# Patient Record
Sex: Female | Born: 1985
Health system: Southern US, Community
[De-identification: ages and names within clinical notes are randomized; demographics above are authoritative.]

## PROBLEM LIST (undated history)

## (undated) DIAGNOSIS — F988 Other specified behavioral and emotional disorders with onset usually occurring in childhood and adolescence: Secondary | ICD-10-CM

## (undated) DIAGNOSIS — I7300 Raynaud's syndrome without gangrene: Secondary | ICD-10-CM

## (undated) HISTORY — PX: TONSILLECTOMY: SUR1361

## (undated) HISTORY — DX: Other specified behavioral and emotional disorders with onset usually occurring in childhood and adolescence: F98.8

## (undated) HISTORY — DX: Raynaud's syndrome without gangrene: I73.00

---

## 2017-07-23 MED FILL — AMPHETAMINE-DEXTROAMPHETAMI: 10 | 30 days supply | Qty: 60 | Fill #0

## 2017-08-20 MED FILL — AMPHETAMINE-DEXTROAMPHETAMI: 10 | 30 days supply | Qty: 60 | Fill #0

## 2017-10-01 ENCOUNTER — Encounter: Payer: Self-pay | Admitting: Family Medicine

## 2017-10-01 ENCOUNTER — Ambulatory Visit (INDEPENDENT_AMBULATORY_CARE_PROVIDER_SITE_OTHER): Payer: No Typology Code available for payment source | Admitting: Family Medicine

## 2017-10-01 VITALS — BP 108/66 | HR 68 | Temp 97.8°F | Ht 69.0 in | Wt 144.2 lb

## 2017-10-01 DIAGNOSIS — Z7689 Persons encountering health services in other specified circumstances: Secondary | ICD-10-CM

## 2017-10-01 DIAGNOSIS — F988 Other specified behavioral and emotional disorders with onset usually occurring in childhood and adolescence: Secondary | ICD-10-CM | POA: Diagnosis not present

## 2017-10-01 DIAGNOSIS — F418 Other specified anxiety disorders: Secondary | ICD-10-CM | POA: Diagnosis not present

## 2017-10-01 MED ORDER — PROPRANOLOL HCL 10 MG PO TABS: 10.0000 mg | ORAL_TABLET | Freq: Every day | ORAL | 2 refills | Status: DC | PRN

## 2017-10-01 MED ORDER — ALPRAZOLAM 0.5 MG PO TABS: 0.5000 mg | ORAL_TABLET | Freq: Every evening | ORAL | 0 refills | Status: DC | PRN

## 2017-10-01 MED FILL — PROPRANOLOL 10 MG TABLET: 10 | 30 days supply | Qty: 30 | Fill #0

## 2017-10-01 MED FILL — ALPRAZolam 0.5 MG TABS: 0.5 | 30 days supply | Qty: 30 | Fill #0

## 2017-10-01 NOTE — Progress Notes (Signed)
   Subjective:    Patient ID: Holly Leblanc, female    DOB: 06/01/1985, 32 y.o.   MRN: [id number]  HPI Chief Complaint  Patient presents with  . other     establish care and med magmt   She is new to the practice and here to establish care.  Previous medical care: Dr. Melida Quitter at [facility] in [facility] and then Dr. Sherlon Handing  She is originally from [place]. Currently in the [facility] Internal Medicine Residency.   Reports history of ADD. Diagnosed as a child. Was on medication, Ritalin, for years and then stopped. States she started back on medication 2 years ago. Prescribed by Dr. Melida Quitter in [facility].  States 10 mg lasts 3-4 hours for her. Takes 10 mg 3-4 times per day depending on her schedule. Doing well on the medication.  Denies history of addiction.   States she has anxiety with public speaking and flying. Takes propanol and alprazolam as needed. Requests refill.   Chronic left trapezius pain. She saw an orthopedist.  Subscapular dyskinesia. May want to try PT in the future if this flares [place].   Denies fever, chills, dizziness, chest pain, palpitations, shortness of breath, abdominal pain, N/V/D, urinary symptoms, LE edema.    Pap smear- 2017. Normal and negative HPV. Abnormal one age 71. Had a cervical biopsy.  IUD will need removed next March.   Other providers: None   Social history: Lives alone, single, works as internal medicine resiident. Works nights.  Denies smoking, drug use. Alcohol use is 1-2 per week.  Diet: healthy  Excerise: daily    Reviewed allergies, medications, past medical, surgical, family, and social history.   Review of Systems Pertinent positives and negatives in the history of present illness.     Objective:   Physical Exam BP 108/66 (BP Location: Left Arm, Patient Position: Sitting)   Pulse 68   Temp 97.8 F (36.6 C)   Ht 5\' 9"  (1.753 m)   Wt 144 lb 3.2 oz (65.4 kg)   SpO2 99%   BMI 21.29 kg/m   Alert and in  no distress. Pharyngeal area is normal. Neck is supple without adenopathy or thyromegaly. Cardiac exam shows a regular sinus rhythm without murmurs or gallops. Lungs are clear to auscultation. Extremities without edema. Skin is warm and dry.       Assessment & Plan:  Attention deficit disorder, unspecified hyperactivity presence  Encounter to establish care  Situational anxiety - Plan: ALPRAZolam (XANAX) 0.5 MG tablet, propranolol (INDERAL) 10 MG tablet  She is a pleasant 32 year old female who is new to the practice. Currently an internal medicine resident for [facility] and new to the area. Situational anxiety with speaking which her new position requires fairly often.  Prescriptions for propranolol and alprazolam sent to her pharmacy. Currently taking Adderall daily.  I will request records from her previous provider in [place] who started her back on this medication.  ADD since childhood.  I will get proper documentation and prescribed medication for her.

## 2017-10-08 ENCOUNTER — Telehealth: Payer: Self-pay | Admitting: Family Medicine

## 2017-10-08 NOTE — Telephone Encounter (Signed)
Received requested records from [employer]. Sending back for review.  ALSO, pt called today to inquire of records had been received. She wanted me to remind Vickie that she is needing a refill on medication. She was to wait until records were received.

## 2017-10-08 NOTE — Telephone Encounter (Signed)
I did not get the records. Can you check on this. Thanks.

## 2017-10-09 ENCOUNTER — Encounter: Payer: Self-pay | Admitting: Family Medicine

## 2017-10-09 ENCOUNTER — Other Ambulatory Visit: Payer: Self-pay | Admitting: Family Medicine

## 2017-10-09 DIAGNOSIS — F909 Attention-deficit hyperactivity disorder, unspecified type: Secondary | ICD-10-CM

## 2017-10-09 MED ORDER — AMPHETAMINE-DEXTROAMPHETAMINE 10 MG PO TABS: 10.0000 mg | ORAL_TABLET | Freq: Two times a day (BID) | ORAL | 0 refills | Status: DC

## 2017-10-09 MED FILL — AMPHETAMINE-DEXTROAMPHETAMI: 10 | 30 days supply | Qty: 60 | Fill #0

## 2017-10-20 ENCOUNTER — Encounter: Payer: Self-pay | Admitting: Family Medicine

## 2017-11-18 ENCOUNTER — Other Ambulatory Visit: Payer: Self-pay | Admitting: Family Medicine

## 2017-11-18 DIAGNOSIS — F909 Attention-deficit hyperactivity disorder, unspecified type: Secondary | ICD-10-CM

## 2017-11-19 NOTE — Telephone Encounter (Deleted)
Is this okay to refill? Last seen 10/01/17

## 2017-11-24 MED FILL — AMPHETAMINE-DEXTROAMPHETAMI: 10 | 30 days supply | Qty: 60 | Fill #0

## 2017-12-22 ENCOUNTER — Ambulatory Visit (INDEPENDENT_AMBULATORY_CARE_PROVIDER_SITE_OTHER): Payer: No Typology Code available for payment source | Admitting: Family Medicine

## 2017-12-22 ENCOUNTER — Encounter: Payer: Self-pay | Admitting: Family Medicine

## 2017-12-22 VITALS — BP 120/70 | HR 72 | Wt 151.4 lb

## 2017-12-22 DIAGNOSIS — Z975 Presence of (intrauterine) contraceptive device: Secondary | ICD-10-CM | POA: Diagnosis not present

## 2017-12-22 DIAGNOSIS — F909 Attention-deficit hyperactivity disorder, unspecified type: Secondary | ICD-10-CM

## 2017-12-22 MED ORDER — AMPHETAMINE-DEXTROAMPHETAMINE 10 MG PO TABS: 10.0000 mg | ORAL_TABLET | Freq: Two times a day (BID) | ORAL | 0 refills | Status: DC

## 2017-12-22 NOTE — Progress Notes (Signed)
   Subjective:    Patient ID: Holly Leblanc, female    DOB: 1985/10/06, 32 y.o.   MRN: [id number]  HPI Chief Complaint  Patient presents with  . med refill    med refill. needs referral to obgyn for pap and IUD removal. has focus.    She is here for a follow-up on ADHD and requesting refill of Adderall. She is taking Adderall 5 mg 2-3 times per day most days. Lasts 3-4 hours at a time.  States at times 10 mg seems to be too much for her.   She also requests referral to OB/GYN.  She currently has an IUD that will soon need to be removed. She prefers to see Holly Leblanc for [facility].   Took propanolol and alprazolam for presentation. Did fine.  States she does not need refills on these.  No new concerns or complaints.  Denies fever, chills, headache, dizziness, chest pain, shortness of breath, abdominal pain, nausea, vomiting, diarrhea.  Review of Systems Pertinent positives and negatives in the history of present illness.     Objective:   Physical Exam BP 120/70   Pulse 72   Wt 151 lb 6.4 oz (68.7 kg)   LMP 12/08/2017   BMI 22.36 kg/m   Alert and oriented and in no acute distress.  Not otherwise examined.      Assessment & Plan:  Attention deficit hyperactivity disorder (ADHD), unspecified ADHD type - Plan: amphetamine-dextroamphetamine (ADDERALL) 10 MG tablet, amphetamine-dextroamphetamine (ADDERALL) 10 MG tablet, amphetamine-dextroamphetamine (ADDERALL) 10 MG tablet  IUD (intrauterine device) in place  She seems to be doing well on Adderall however she has cut her dose back to 5 mg at a time for now.  Medication seems to be lasting an appropriate length of time and no obvious side effects.  504-month refill provided.  She will let me know if we need to adjust the dose Referral to OB/GYN for IUD that is due to be removed shortly. Follow-up in 3 months

## 2017-12-23 ENCOUNTER — Ambulatory Visit: Payer: No Typology Code available for payment source | Admitting: Family Medicine

## 2018-01-05 MED FILL — AMPHETAMINE-DEXTROAMPHETAMI: 10 | 30 days supply | Qty: 60 | Fill #0

## 2018-02-08 ENCOUNTER — Encounter: Payer: Self-pay | Admitting: Advanced Practice Midwife

## 2018-02-08 ENCOUNTER — Ambulatory Visit (INDEPENDENT_AMBULATORY_CARE_PROVIDER_SITE_OTHER): Payer: No Typology Code available for payment source | Admitting: Advanced Practice Midwife

## 2018-02-08 VITALS — BP 105/70 | HR 69 | Ht 69.0 in | Wt 145.0 lb

## 2018-02-08 DIAGNOSIS — N6012 Diffuse cystic mastopathy of left breast: Secondary | ICD-10-CM

## 2018-02-08 DIAGNOSIS — N6011 Diffuse cystic mastopathy of right breast: Secondary | ICD-10-CM

## 2018-02-08 DIAGNOSIS — Z01419 Encounter for gynecological examination (general) (routine) without abnormal findings: Secondary | ICD-10-CM

## 2018-02-08 DIAGNOSIS — Z30432 Encounter for removal of intrauterine contraceptive device: Secondary | ICD-10-CM

## 2018-02-08 DIAGNOSIS — Z3009 Encounter for other general counseling and advice on contraception: Secondary | ICD-10-CM

## 2018-02-08 NOTE — Progress Notes (Signed)
Subjective:     Holly Leblanc is a 33 y.o. female here for a routine exam.  Current complaints: None. Holly Leblanc is 33 years old, pt desires IUD removal.  Some bleeding after intercourse with Holly Leblanc, no other problems.  Is not currently sexually active so does not desire new contraceptive method today.   Personal health questionnaire reviewed: yes.   Gynecologic History Patient's last menstrual period was 02/05/2018. Contraception: IUD Last Pap: 2017. Results were: normal Last mammogram: 2018 for abnormal exam. Results were: normal/benign breast changes per pt  Obstetric History OB History  No obstetric history on file.     The following portions of the patient's history were reviewed and updated as appropriate: allergies, current medications, past family history, past medical history, past social history, past surgical history and problem list.  Review of Systems Pertinent items noted in HPI and remainder of comprehensive ROS otherwise negative.    Objective:   VS reviewed, nursing note reviewed,  Constitutional: well developed, well nourished, no distress HEENT: normocephalic CV: normal rate Pulm/chest wall: normal effort Breast Exam:  right breast normal with some rope-like soft, smooth, round masses, skin or nipple changes or axillary nodes, left breast normal with some rope-like soft, smooth, round masses, skin or nipple changes or axillary nodes Abdomen: soft Neuro: alert and oriented x 3 Skin: warm, dry Psych: affect normal Pelvic exam: Cervix pink, visually closed, without lesion, IUD string visible, approximately 3 cm in length from cervical os, small amount dark red/brown bleeding, vaginal walls and external genitalia normal Bimanual exam: Cervix 0/long/high, firm, anterior, neg CMT, uterus nontender, nonenlarged, adnexa without tenderness, enlargement, or mass  IUD Removal  Patient was in the dorsal lithotomy position, normal external genitalia was noted.  A  speculum was placed in the patient's vagina, normal discharge was noted, no lesions. The nulliparous cervix was visualized, no lesions, no abnormal discharge.  The strings of the IUD were grasped and pulled using ring forceps. The IUD was removed in its entirety. Patient tolerated the procedure well.    Patient is not sexually active and does not desire contraception currently.    Routine preventative health maintenance measures emphasized.      Assessment/Plan:   1. Well woman exam with routine gynecological exam --Pap, physical exam.  2. Encounter for IUD removal --Pt tolerated well. See procedure note above.  3. Encounter for counseling regarding contraception --Discussed LARCs as most effective forms of birth control.  Discussed benefits/risks of other methods.  Pt desires no contraception because she is not currently sexually active.  Will seek care when she needs contraception in the future.   4. Fibrocystic breast changes of both breasts --Benign breast exam, some tenderness in left breast c/w fibrocystic changes.  Discussed breast self awareness, self exams as desired.    Contraception: abstinence  Follow up in: 1 year or as needed.   Sharen Counter, CNM 12:28 PM

## 2018-02-09 ENCOUNTER — Encounter: Payer: Self-pay | Admitting: Family Medicine

## 2018-02-10 MED FILL — AMPHETAMINE-DEXTROAMPHETAMI: 10 | 30 days supply | Qty: 60 | Fill #0

## 2018-02-11 LAB — CYTOLOGY - PAP

## 2018-03-19 MED FILL — AMPHETAMINE-DEXTROAMPHETAMI: 10 | 30 days supply | Qty: 60 | Fill #0

## 2018-03-31 ENCOUNTER — Other Ambulatory Visit: Payer: Self-pay | Admitting: Family Medicine

## 2018-03-31 ENCOUNTER — Encounter: Payer: Self-pay | Admitting: Family Medicine

## 2018-03-31 DIAGNOSIS — F909 Attention-deficit hyperactivity disorder, unspecified type: Secondary | ICD-10-CM

## 2018-03-31 MED ORDER — ALPRAZOLAM 0.5 MG PO TABS: 0.5000 mg | ORAL_TABLET | Freq: Every day | ORAL | 0 refills | Status: DC | PRN

## 2018-03-31 MED ORDER — AMPHETAMINE-DEXTROAMPHETAMINE 10 MG PO TABS: 10.0000 mg | ORAL_TABLET | Freq: Two times a day (BID) | ORAL | 0 refills | Status: DC

## 2018-03-31 NOTE — Telephone Encounter (Signed)
Is this okay to refill? 

## 2018-03-31 NOTE — Telephone Encounter (Signed)
Sent to [facility] outpatient pharmacy due to recent temporary closure of [facility] pharmacy.

## 2018-04-01 MED FILL — ALPRAZolam 0.5 MG TABS: 0.5 | 20 days supply | Qty: 20 | Fill #0

## 2018-04-01 MED FILL — AMPHETAMINE-DEXTROAMPHETAMI: 10 | 30 days supply | Qty: 60 | Fill #0

## 2018-05-15 ENCOUNTER — Other Ambulatory Visit: Payer: Self-pay | Admitting: Family Medicine

## 2018-05-15 DIAGNOSIS — F909 Attention-deficit hyperactivity disorder, unspecified type: Secondary | ICD-10-CM

## 2018-05-17 MED ORDER — AMPHETAMINE-DEXTROAMPHETAMINE 10 MG PO TABS: 10.0000 mg | ORAL_TABLET | Freq: Two times a day (BID) | ORAL | 0 refills | Status: DC

## 2018-05-17 NOTE — Telephone Encounter (Signed)
Is this okay to refill? 

## 2018-05-17 NOTE — Telephone Encounter (Signed)
Let's schedule a virtual visit since I have not discussed this with her since December. Thanks.

## 2018-06-05 MED FILL — AMPHETAMINE-DEXTROAMPHETAMI: 10 | 30 days supply | Qty: 60 | Fill #0

## 2018-07-08 ENCOUNTER — Other Ambulatory Visit: Payer: Self-pay | Admitting: Family Medicine

## 2018-07-08 DIAGNOSIS — F909 Attention-deficit hyperactivity disorder, unspecified type: Secondary | ICD-10-CM

## 2018-07-08 MED ORDER — AMPHETAMINE-DEXTROAMPHETAMINE 10 MG PO TABS: 10.0000 mg | ORAL_TABLET | Freq: Two times a day (BID) | ORAL | 0 refills | Status: DC

## 2018-07-08 MED FILL — AMPHETAMINE-DEXTROAMPHETAMI: 10 | 30 days supply | Qty: 60 | Fill #0

## 2018-07-08 NOTE — Telephone Encounter (Signed)
Is this okay to refill? 

## 2018-07-09 ENCOUNTER — Encounter: Payer: Self-pay | Admitting: Family Medicine

## 2018-08-10 NOTE — Progress Notes (Signed)
Subjective:    Patient ID: Holly Leblanc, female    DOB: 01-08-1985, 33 y.o.   MRN: [id number]  HPI Chief Complaint  Patient presents with  . Gynecologic Exam    repeat   . Medication Refill    Adderall  . Back Pain    upper back pain and wants referral    Here for follow up on ADHD. Taking Adderall 5 mg 2-4 times per day.  It is lasting 2-3 hours at a time. States she has never tried taking the whole tablet (10 mg).  No side effects. Normal appetite. Sleeping ok but does take melatonin some nights. Cut back on caffeine.   States she had a pap smear at her OB/GYN office in February 2020 and it came back unsatisfactory. Is overdue to have another pap smear. Would like to do this here today.   LMP: one week ago. irregular. IUD removed in 02/2018. Has had 3 periods since and bleeding has been light and shorter.  Is not on birth control. States she would be ok if she were to get pregnant.  Is considering freezing eggs and will check with OB/GYN  Would like to have STD testing today.   Complains of chronic left low back, left trapezius and left mid back. She saw an orthopedist in [place], [place].  Had XRs in the past. Pain is diffuse on her left side.  Pain is worse with sitting. At times she has spasms. Pain has awakened her before but not usually.  Using Voltaren gel and Biofreeze. Takes NSAIDs occasionally.   Denies fever, chills, body aches, numbness, tingling or weakness, no abdominal pain, N/V/D, urinary symptoms, vaginal discharge or dyspareunia.   Reviewed allergies, medications, past medical, surgical, family, and social history.   Review of Systems Pertinent positives and negatives in the history of present illness.     Objective:   Physical Exam Exam conducted with a chaperone present.  Constitutional:      General: She is not in acute distress.    Appearance: Normal appearance. She is not ill-appearing.  Abdominal:     General: Abdomen is flat.   Palpations: Abdomen is soft.     Tenderness: There is no abdominal tenderness.  Genitourinary:    General: Normal vulva.     Labia:        Right: No rash, tenderness or lesion.        Left: No rash, tenderness or lesion.      Vagina: Normal.     Cervix: Normal.     Uterus: Normal.      Adnexa: Right adnexa normal and left adnexa normal.  Neurological:     Mental Status: She is alert.    BP 100/70   Pulse 67   Temp 98 F (36.7 C) (Oral)   Ht 5\' 9"  (1.753 m)   Wt 144 lb 12.8 oz (65.7 kg)   SpO2 98%   BMI 21.38 kg/m       Assessment & Plan:  Attention deficit disorder, unspecified hyperactivity presence - Plan: amphetamine-dextroamphetamine (ADDERALL) 10 MG tablet, discussed that she is having to take the 5 mg dose too often. She will try taking the whole tablet 10 mg and let me know how this works for her.   Screening for cervical cancer - Plan: Cytology - PAP([place]), follow up pending results.   Other chronic back pain - Plan: Ambulatory referral to Physical Therapy   Unprotected sexual intercourse - Plan: RPR, HIV Antibody (routine  testing w rflx), states she will be pleased if she were to get pregnant. No contraception desired.

## 2018-08-11 ENCOUNTER — Encounter: Payer: Self-pay | Admitting: Family Medicine

## 2018-08-11 ENCOUNTER — Ambulatory Visit (INDEPENDENT_AMBULATORY_CARE_PROVIDER_SITE_OTHER): Payer: No Typology Code available for payment source | Admitting: Family Medicine

## 2018-08-11 ENCOUNTER — Other Ambulatory Visit: Payer: Self-pay

## 2018-08-11 ENCOUNTER — Other Ambulatory Visit (HOSPITAL_COMMUNITY)
Admission: RE | Admit: 2018-08-11 | Discharge: 2018-08-11 | Disposition: A | Discharge status: Home / Self Care | Payer: No Typology Code available for payment source | Source: Ambulatory Visit | Attending: Family Medicine | Admitting: Family Medicine

## 2018-08-11 VITALS — BP 100/70 | HR 67 | Temp 98.0°F | Ht 69.0 in | Wt 144.8 lb

## 2018-08-11 DIAGNOSIS — Z7251 High risk heterosexual behavior: Secondary | ICD-10-CM | POA: Diagnosis not present

## 2018-08-11 DIAGNOSIS — G8929 Other chronic pain: Secondary | ICD-10-CM

## 2018-08-11 DIAGNOSIS — M549 Dorsalgia, unspecified: Secondary | ICD-10-CM | POA: Diagnosis not present

## 2018-08-11 DIAGNOSIS — Z124 Encounter for screening for malignant neoplasm of cervix: Secondary | ICD-10-CM | POA: Insufficient documentation

## 2018-08-11 DIAGNOSIS — F988 Other specified behavioral and emotional disorders with onset usually occurring in childhood and adolescence: Secondary | ICD-10-CM

## 2018-08-11 MED ORDER — AMPHETAMINE-DEXTROAMPHETAMINE 10 MG PO TABS: 10.0000 mg | ORAL_TABLET | Freq: Two times a day (BID) | ORAL | 0 refills | Status: DC

## 2018-08-11 MED FILL — AMPHETAMINE-DEXTROAMPHETAMI: 10 | 30 days supply | Qty: 60 | Fill #0

## 2018-08-11 NOTE — Patient Instructions (Addendum)
Try taking the full 10 mg Adderall tablet and let's see how you do with this. Keep me posted please.   We will contact you with your results. You should hear from [facility] in the next few days.

## 2018-08-12 LAB — RPR: RPR Ser Ql: NONREACTIVE

## 2018-08-12 LAB — CYTOLOGY - PAP
Adequacy: ABSENT
Chlamydia: NEGATIVE
Diagnosis: NEGATIVE
HPV: NOT DETECTED
Neisseria Gonorrhea: NEGATIVE
Trichomonas: NEGATIVE

## 2018-08-12 LAB — HIV ANTIBODY (ROUTINE TESTING W REFLEX): HIV Screen 4th Generation wRfx: NONREACTIVE

## 2018-08-19 ENCOUNTER — Encounter: Payer: Self-pay | Admitting: Family Medicine

## 2018-08-29 ENCOUNTER — Encounter: Payer: Self-pay | Admitting: Family Medicine

## 2018-08-29 DIAGNOSIS — Z3162 Encounter for fertility preservation counseling: Secondary | ICD-10-CM

## 2018-09-10 ENCOUNTER — Other Ambulatory Visit: Payer: Self-pay | Admitting: Family Medicine

## 2018-09-10 DIAGNOSIS — F988 Other specified behavioral and emotional disorders with onset usually occurring in childhood and adolescence: Secondary | ICD-10-CM

## 2018-09-14 ENCOUNTER — Telehealth: Payer: Self-pay | Admitting: Internal Medicine

## 2018-09-14 ENCOUNTER — Other Ambulatory Visit: Payer: Self-pay | Admitting: Family Medicine

## 2018-09-14 DIAGNOSIS — F988 Other specified behavioral and emotional disorders with onset usually occurring in childhood and adolescence: Secondary | ICD-10-CM

## 2018-09-14 NOTE — Telephone Encounter (Signed)
Pt is being seen tomorrow for a virtual with you.

## 2018-09-14 NOTE — Telephone Encounter (Signed)
Left message for pt to call back to schedule.

## 2018-09-14 NOTE — Telephone Encounter (Signed)
Pt called and scheduled something for sept 16th but states that she is working 12 hour shifts for the next 2 weeks and can't do virtuals during the day as she is sleeping. Wants to know if she can get a refill

## 2018-09-14 NOTE — Telephone Encounter (Signed)
Will need a virtual visit in order for me to refill ADHD medication. This is a controlled substance and requires proper documentation.

## 2018-09-14 NOTE — Telephone Encounter (Signed)
Is this okay to refill? 

## 2018-09-14 NOTE — Telephone Encounter (Signed)
Let's do a virtual visit before refilling since I recommended increasing her dose to the full 10 mg last month. Also, we referred her to PT and I would like to see how this is going.

## 2018-09-15 ENCOUNTER — Other Ambulatory Visit: Payer: Self-pay

## 2018-09-15 ENCOUNTER — Ambulatory Visit (INDEPENDENT_AMBULATORY_CARE_PROVIDER_SITE_OTHER): Payer: No Typology Code available for payment source | Admitting: Family Medicine

## 2018-09-15 ENCOUNTER — Encounter: Payer: Self-pay | Admitting: Family Medicine

## 2018-09-15 VITALS — Wt 142.0 lb

## 2018-09-15 DIAGNOSIS — F988 Other specified behavioral and emotional disorders with onset usually occurring in childhood and adolescence: Secondary | ICD-10-CM

## 2018-09-15 MED ORDER — AMPHETAMINE-DEXTROAMPHETAMINE 10 MG PO TABS: 10.0000 mg | ORAL_TABLET | Freq: Two times a day (BID) | ORAL | 0 refills | Status: DC

## 2018-09-15 MED FILL — AMPHETAMINE-DEXTROAMPHETAMI: 10 | 30 days supply | Qty: 60 | Fill #0

## 2018-09-15 NOTE — Progress Notes (Addendum)
   Subjective:  Documentation for virtual audio and video telecommunications through [employer] encounter:  The patient was located at home. 2 patient identifiers used.  The provider was located in the office. The patient did consent to this visit and is aware of possible charges through their insurance for this visit.  The other persons participating in this telemedicine service were none.    Patient ID: Holly Leblanc, female    DOB: 1985/03/15, 33 y.o.   MRN: [id number]  HPI Chief Complaint  Patient presents with  . refill on adderall    refill on adderall. taking .5 up to 3 times a day.    This is a 4 week follow up to discuss ADHD and current medication regimen. At the previous visit she reported taking 5 mg Adderall 2-4 times per day. She reported then that she had never taken 10 mg at one time. Stated it was "too much medication" for her.  States the 5 mg dose lasts around 4 hours for her. Denies any side effects when it wears off.  Sleeping well. No appetite concerns.  She does not want to take the 10 mg tablet but is cutting it in half.   No concerns today.   Denies fever, chills, N/V.   Back pain- did not see PT yet due to insurance concerns. Has upcoming appt.     Review of Systems Pertinent positives and negatives in the history of present illness.     Objective:   Physical Exam Wt 142 lb (64.4 kg)   BMI 20.97 kg/m   Alert and oriented in no acute distress.  Not examined otherwise      Assessment & Plan:  Attention deficit disorder, unspecified hyperactivity presence - Plan: amphetamine-dextroamphetamine (ADDERALL) 10 MG tablet, amphetamine-dextroamphetamine (ADDERALL) 10 MG tablet, amphetamine-dextroamphetamine (ADDERALL) 10 MG tablet  She is adamant that he does not want to take 10 mg tablets.  She will continue on the 5 mg dose 3-4 times per day for now.  She is happy with this regimen.  We did discuss trying an extended release dose but she  declines. No issues with sleep or appetite. Follow-up in 3 months.  At that time I would like to find out examples of how she knows the medication is working for her and how she knows when the medication wears off  Time spent on call was 16 minutes and in review of previous records 2 minutes total.  This virtual service is not related to other E/M service within previous 7 days.

## 2018-09-22 ENCOUNTER — Encounter: Payer: No Typology Code available for payment source | Admitting: Family Medicine

## 2018-10-19 MED FILL — AMPHETAMINE-DEXTROAMPHETAMI: 10 | 30 days supply | Qty: 60 | Fill #0

## 2018-11-02 ENCOUNTER — Encounter: Payer: Self-pay | Admitting: Family Medicine

## 2018-11-16 ENCOUNTER — Other Ambulatory Visit: Payer: Self-pay | Admitting: Internal Medicine

## 2018-11-16 DIAGNOSIS — G8929 Other chronic pain: Secondary | ICD-10-CM

## 2018-11-22 MED FILL — AMPHETAMINE-DEXTROAMPHETAMI: 10 | 30 days supply | Qty: 60 | Fill #0

## 2018-12-01 MED FILL — ESTRADIOL 0.1 MG PATCH: 0.1 | 28 days supply | Qty: 8 | Fill #0

## 2018-12-09 ENCOUNTER — Ambulatory Visit: Payer: No Typology Code available for payment source

## 2018-12-15 MED FILL — MENOPUR 75 UNIT VIAL: 75 | 10 days supply | Qty: 10 | Fill #0

## 2018-12-16 ENCOUNTER — Other Ambulatory Visit: Payer: Self-pay

## 2018-12-16 DIAGNOSIS — F988 Other specified behavioral and emotional disorders with onset usually occurring in childhood and adolescence: Secondary | ICD-10-CM

## 2018-12-16 MED ORDER — AMPHETAMINE-DEXTROAMPHETAMINE 10 MG PO TABS: 10.0000 mg | ORAL_TABLET | Freq: Two times a day (BID) | ORAL | 0 refills | Status: DC

## 2018-12-16 NOTE — Telephone Encounter (Signed)
Ok to give her a 30 day refill.

## 2018-12-16 NOTE — Telephone Encounter (Signed)
Pt. Called wanted to schedule a med check apt. She wanted tomorrow or early next week, but you are off those days, got her scheduled on 01/13/19 for a virtual med check, but pt. Stated she needs a refill on her Adderall before the weekend she is almost out of medication.

## 2018-12-17 MED FILL — GONAL-F RFF REDI-JECT 450 U: 450 | 3 days supply | Qty: 2 | Fill #0

## 2018-12-22 MED FILL — AMPHETAMINE-DEXTROAMPHETAMI: 10 | 30 days supply | Qty: 60 | Fill #0

## 2019-01-13 ENCOUNTER — Encounter: Payer: Self-pay | Admitting: Family Medicine

## 2019-01-13 ENCOUNTER — Ambulatory Visit (INDEPENDENT_AMBULATORY_CARE_PROVIDER_SITE_OTHER): Payer: No Typology Code available for payment source | Admitting: Family Medicine

## 2019-01-13 ENCOUNTER — Other Ambulatory Visit: Payer: Self-pay

## 2019-01-13 VITALS — BP 101/64 | HR 70 | Temp 97.7°F | Resp 16 | Wt 140.0 lb

## 2019-01-13 DIAGNOSIS — F988 Other specified behavioral and emotional disorders with onset usually occurring in childhood and adolescence: Secondary | ICD-10-CM

## 2019-01-13 MED ORDER — AMPHETAMINE-DEXTROAMPHETAMINE 10 MG PO TABS: 10.0000 mg | ORAL_TABLET | Freq: Two times a day (BID) | ORAL | 0 refills | Status: DC

## 2019-01-13 NOTE — Progress Notes (Signed)
   Subjective:  Documentation for virtual audio and video telecommunications through [employer] encounter:  The patient was located at work. 2 patient identifiers used.  The provider was located in the office. The patient did consent to this visit and is aware of possible charges through their insurance for this visit.  The other persons participating in this telemedicine service were none.    Patient ID: Holly Leblanc, female    DOB: May 09, 1985, 34 y.o.   MRN: [id number]  HPI Chief Complaint  Patient presents with  . med check    med check- needs refill on adderall   This is a medication management visit for ADHD.   She is taking Adderall 5 mg 2-3 times per day. Takes break on the weekends.   Takes 10 mg when she has a big day at work.  Denies any issues with sleep or appetite.   Regular cycles.  Is not sexually active.   No other concerns today.   Reviewed allergies, medications, past medical, surgical, family, and social history.   Review of Systems Pertinent positives and negatives in the history of present illness.     Objective:   Physical Exam BP 101/64   Pulse 70   Temp 97.7 F (36.5 C) (Oral)   Resp 16   Wt 140 lb (63.5 kg)   SpO2 100%   BMI 20.67 kg/m   Alert and oriented and in no acute distress.  Normal speech, mood and thought process.      Assessment & Plan:  Attention deficit disorder, unspecified hyperactivity presence - Plan: amphetamine-dextroamphetamine (ADDERALL) 10 MG tablet, amphetamine-dextroamphetamine (ADDERALL) 10 MG tablet, amphetamine-dextroamphetamine (ADDERALL) 10 MG tablet  She is in her usual state of health.  Seems to be doing well on her current regimen of Adderall.  I will refill her medication for 3 months.  She will follow-up at that time either in office or in virtual  Time spent on call was 13 minutes and in review of previous records 20 minutes total.  This virtual service is not related to other E/M service  within previous 7 days.

## 2019-01-25 MED FILL — AMPHETAMINE-DEXTROAMPHETAMI: 10 | 30 days supply | Qty: 60 | Fill #0

## 2019-02-18 ENCOUNTER — Other Ambulatory Visit: Payer: Self-pay | Admitting: Internal Medicine

## 2019-02-18 ENCOUNTER — Encounter: Payer: Self-pay | Admitting: Family Medicine

## 2019-02-18 DIAGNOSIS — M549 Dorsalgia, unspecified: Secondary | ICD-10-CM

## 2019-02-18 DIAGNOSIS — G8929 Other chronic pain: Secondary | ICD-10-CM

## 2019-02-20 ENCOUNTER — Encounter: Payer: Self-pay | Admitting: Family Medicine

## 2019-03-02 MED FILL — AMPHETAMINE-DEXTROAMPHETAMI: 10 | 30 days supply | Qty: 60 | Fill #0

## 2019-03-10 ENCOUNTER — Ambulatory Visit: Payer: No Typology Code available for payment source | Attending: Family Medicine | Admitting: Physical Therapy

## 2019-03-10 ENCOUNTER — Encounter: Payer: Self-pay | Admitting: Physical Therapy

## 2019-03-10 ENCOUNTER — Other Ambulatory Visit: Payer: Self-pay

## 2019-03-10 DIAGNOSIS — M545 Low back pain, unspecified: Secondary | ICD-10-CM

## 2019-03-10 DIAGNOSIS — M6281 Muscle weakness (generalized): Secondary | ICD-10-CM | POA: Insufficient documentation

## 2019-03-10 DIAGNOSIS — M25552 Pain in left hip: Secondary | ICD-10-CM | POA: Insufficient documentation

## 2019-03-10 DIAGNOSIS — M25512 Pain in left shoulder: Secondary | ICD-10-CM | POA: Diagnosis not present

## 2019-03-10 DIAGNOSIS — G8929 Other chronic pain: Secondary | ICD-10-CM | POA: Insufficient documentation

## 2019-03-10 NOTE — Therapy (Addendum)
[place], [place], [address] Phone: [phone]   Fax:  [phone]  Physical Therapy Evaluation / Discharge  Patient Details  Name: Holly Leblanc MRN: [id number] Date of Birth: 04-10-1985 Referring Provider (PT): Girtha Rm, NP-C   Encounter Date: 03/10/2019  PT End of Session - 03/10/19 0930    Visit Number  1    Number of Visits  8    Date for PT Re-Evaluation  05/05/19    Authorization Type  [facility] [employer]    PT Start Time  0915    PT Stop Time  1010    PT Time Calculation (min)  55 min    Activity Tolerance  Patient tolerated treatment well    Behavior During Therapy  [facility] for tasks assessed/performed       Past Medical History:  Diagnosis Date  . ADD (attention deficit disorder)     Past Surgical History:  Procedure Laterality Date  . TONSILLECTOMY      There were no vitals filed for this visit.   Subjective Assessment - 03/10/19 0918    Subjective  Patient reports chronic left sided pain that has progressed from starting in left scapular region, progressing to left shoulder including chest, sternum, and upper trap, and progressing down left side of back and left hip and proximal hamstring region. She states that she believes this is from being left handed and uneven which has changed how she sits and moves. She has worked on her posture in the past and has started to foam roll which has helped some. Pain mainly occurs when she is performing heavy exercise and when she sits she feels discomfort of ischial tuberosity.    Limitations  Sitting;Lifting    How long can you sit comfortably?  Feels the ischial tuberosity    How long can you stand comfortably?  No limitation    How long can you walk comfortably?  No limitation    Patient Stated Goals  Improve pain so she can perform all activity without limitation    Currently in Pain?  Yes    Pain Score  1     Pain Location  --   Left  shoulder to left hip   Pain Orientation  Left    Pain Descriptors / Indicators  Tightness;Aching    Pain Type  Chronic pain    Pain Radiating Towards  Left scapular region, left shoulder and chest, left trunk, left hip and ischial tuberosity region    Pain Onset  More than a month ago    Pain Frequency  Constant    Aggravating Factors   Sitting, working out shoulders and hips    Pain Relieving Factors  Rest    Effect of Pain on Daily Activities  Patient feels she is limited in her exercise and recreational activities         [facility] PT Assessment - 03/10/19 0001      Assessment   Medical Diagnosis  Chronic back pain    Referring Provider (PT)  Raenette Rover, Vickie L, NP-C    Onset Date/Surgical Date  --   patient reports for numerous years   Hand Dominance  Left    Next MD Visit  within next 3 months    Prior Therapy  Yes - for left hip region      Precautions   Precautions  None      Restrictions   Weight Bearing Restrictions  No  Balance Screen   Has the patient fallen in the past 6 months  No    Has the patient had a decrease in activity level because of a fear of falling?   No    Is the patient reluctant to leave their home because of a fear of falling?   No      Prior Function   Level of Independence  Independent    Vocation Requirements  Patient is an internal medicine resident at [employer]  Exercise, martial arts      Cognition   Overall Cognitive Status  Within Functional Limits for tasks assessed      Observation/Other Assessments   Observations  Patient appears in no apparent distress    Focus on Therapeutic Outcomes (FOTO)   28% limitation      Functional Tests   Functional tests  Squat      Squat   Comments  No significant deviations, she does exhibit a quad dominant form      Posture/Postural Control   Posture Comments  Patient exhibits rounded shoulder posture, mild forward head      ROM / Strength   AROM / PROM / Strength   PROM;AROM;Strength      AROM   Overall AROM Comments  Shoulder and Lumbar AROM grossly WFL bilaterally without pain, except:    AROM Assessment Site  Shoulder;Lumbar    Right/Left Shoulder  Left    Left Shoulder Internal Rotation  --   increased scapular winging when reaching behind her back    Lumbar Flexion  --   patient reports pulling in left proximal hamstring     PROM   Overall PROM Comments  Hip PROM grossly WFL and non-painful, reports stretch of piriformis on left      Strength   Overall Strength Comments  Periscapular strength grossly 4/5 bilat, patient reports left shoulder felt weaker;     Strength Assessment Site  Hip;Knee;Shoulder    Right/Left Shoulder  Right;Left    Right Shoulder Flexion  5/5    Right Shoulder Extension  5/5    Right Shoulder ABduction  5/5    Right Shoulder Internal Rotation  5/5    Right Shoulder External Rotation  5/5    Left Shoulder Flexion  5/5    Left Shoulder Extension  5/5    Left Shoulder ABduction  5/5    Left Shoulder Internal Rotation  5/5    Left Shoulder External Rotation  5/5    Right/Left Hip  Right;Left    Right Hip Flexion  4+/5    Right Hip Extension  4/5    Right Hip Internal Rotation  4/5    Right Hip ABduction  4/5    Left Hip Flexion  4/5    Left Hip Extension  4-/5    Left Hip Internal Rotation  4/5    Left Hip ABduction  4-/5   patient report lateral hip discomfort   Right/Left Knee  Right;Left    Right Knee Flexion  5/5    Right Knee Extension  5/5    Left Knee Flexion  5/5    Left Knee Extension  5/5      Flexibility   Soft Tissue Assessment /Muscle Length  yes    Hamstrings  WNL    Quadriceps  WNL    Piriformis  Reported tightness greater on left      Special Tests    Special Tests  Rotator Cuff Impingement;Biceps/Labral  Tests;Hip Special Tests;Sacrolliac Tests    Rotator Cuff Impingment tests  Neer impingement test;Hawkins- Merrilyn Puma test;Speed's test    Sacroiliac Tests   Sacral Thrust    Hip Special  Tests   Saralyn Pilar (FABER) Test;Hip Scouring;Anterior Hip Impingement Test;Piriformis Test      Neer Impingement test    Findings  Negative      Hawkins-Kennedy test   Findings  Negative      Speed's test   Findings  Negative      Sacral thrust    Findings  Negative      Saralyn Pilar (FABER) Test   Comments  Mildly positive on left for lateral hip pain      Hip Scouring   Findings  Negative      Anterior Hip Impingement Test    Findings  Negative      Piriformis Test   Findings  Negative      Transfers   Transfers  Independent with all Transfers      Ambulation/Gait   Ambulation/Gait  Yes    Ambulation/Gait Assistance  7: Independent    Gait Comments  No significant deviations                Objective measurements completed on examination: See above findings.      [facility] Adult PT Treatment/Exercise - 03/10/19 0001      Exercises   Exercises  Shoulder;Knee/Hip      Knee/Hip Exercises: Supine   Bridges  10 reps    Bridges Limitations  3 sec hold with red band around knees       Knee/Hip Exercises: Sidelying   Clams  x15 with red band      Shoulder Exercises: Prone   Other Prone Exercises  Prone I, T, Y with 3 sec hold x5 each              PT Education - 03/10/19 0623    Education Details  Exam findings, POC, HEP    Person(s) Educated  Patient    Methods  Explanation;Demonstration;Tactile cues;Verbal cues;Handout    Comprehension  Verbalized understanding;Returned demonstration;Verbal cues required;Tactile cues required;Need further instruction       PT Short Term Goals - 03/10/19 1121      PT SHORT TERM GOAL #1   Title  Patient will be I with initial HEP to progress with therapy    Time  4    Period  Weeks    Status  New    Target Date  04/07/19      PT SHORT TERM GOAL #2   Title  Patient will report improved ability to perform usual work and school activities with no difficulty    Time  4    Period  Weeks    Status  New    Target  Date  04/07/19        PT Long Term Goals - 03/10/19 1125      PT LONG TERM GOAL #1   Title  Patient will be I with advanced HEP to maintain progress    Time  8    Period  Weeks    Status  New    Target Date  05/05/19      PT LONG TERM GOAL #2   Title  Patient will report no difficulty performing vigorous exercise or recreational activities    Time  8    Period  Weeks    Status  New    Target Date  05/05/19  PT LONG TERM GOAL #3   Title  Patient will report no difficulty sitting >1 hour to allow for improved working ability    Time  8    Period  Weeks    Status  New    Target Date  05/05/19      PT LONG TERM GOAL #4   Title  Patient will exhibit improved gluteal and periscapular strength to allow for to feel more even and balanced    Time  8    Period  Weeks    Status  New    Target Date  05/05/19             Plan - 03/10/19 0930    Clinical Impression Statement  Patient reports to PT with report of chronic left sided scapular pain that has gradually worsened to left shoulder/neck, left sided upper and lower back, and left hip pain with activity and sitting. Her main impairments include strength deficits of the core, left gluteal region, and left periscapular region, and she exhibits increased scapular winging when reaching behind her back on the left. She has good range of motion and flexibility throughout, but does not trigger points of left lat region. It is unclear what is generating her pain outside of compensation from muscular strength and endurance deficits. She would benefit from continued skilled PT to progress her strength in order to reduce pain with activity and sitting.    Personal Factors and Comorbidities  Time since onset of injury/illness/exacerbation    Examination-Activity Limitations  Lift;Sit    Examination-Participation Restrictions  Community Activity;School    Stability/Clinical Decision Making  Stable/Uncomplicated    Clinical Decision  Making  Low    Rehab Potential  Good    PT Frequency  1x / week    PT Duration  8 weeks    PT Treatment/Interventions  ADLs/Self Care Home Management;Cryotherapy;Electrical Stimulation;Moist Heat;Neuromuscular re-education;Balance training;Therapeutic exercise;Therapeutic activities;Functional mobility training;Stair training;Gait training;Patient/family education;Manual techniques;Dry needling;Passive range of motion;Taping;Spinal Manipulations;Joint Manipulations    PT Next Visit Plan  Assess HEP and progress PRN, manual and dry needling for left shoulder/lat PRN, progress gluteal and strength exercises (bird dog, hip hinge technique), periscapular strengthening    PT Home Exercise Plan  Clamshell with red, bridge with red; prone I, T, Y    Consulted and Agree with Plan of Care  Patient       Patient will benefit from skilled therapeutic intervention in order to improve the following deficits and impairments:  Decreased strength, Postural dysfunction, Decreased activity tolerance, Pain, Increased muscle spasms  Visit Diagnosis: Chronic left shoulder pain  Chronic left-sided low back pain without sciatica  Pain in left hip  Muscle weakness (generalized)     Problem List Patient Active Problem List   Diagnosis Date Noted  . Notalgia 08/11/2018  . Situational anxiety 10/01/2017  . ADD (attention deficit disorder)     Hilda Blades, PT, DPT, LAT, ATC 03/10/19  12:31 PM Phone: [phone] Fax: [facility] [address] [place], [place], [address] Phone: [phone]   Fax:  [phone]  Name: Holly Leblanc MRN: [id number] Date of Birth: 05/13/1985    PHYSICAL THERAPY DISCHARGE SUMMARY  Visits from Start of Care: 1  Current functional level related to goals / functional outcomes: See above   Remaining deficits: See above   Education / Equipment: HEP Plan: Patient agrees to discharge.   Patient goals were not met. Patient is being discharged due to  not returning since the last visit.  ?????    Hilda Blades, PT, DPT, LAT, ATC 06/15/19  8:54 AM Phone: [phone] Fax: [phone]

## 2019-03-10 NOTE — Patient Instructions (Signed)
Access Code: [email or web address]  URL: [email or web address]  Date: 03/10/2019  Prepared by: Rosana Hoes   Exercises Clamshell with Resistance - 15 reps - 3 sets - 1x daily - 7x weekly Supine Bridge with Resistance Band - 10 reps - 3 sets - 1x daily - 7x weekly Prone Scapular Slide with Shoulder Extension - 10 reps - 2 sets - 3 seconds hold - 1x daily - 7x weekly Prone T - 10 reps - 2 sets - 3 seconds hold - 1x daily - 7x weekly Prone Y - 10 reps - 2 sets - 3 seconds hold - 1x daily - 7x weekly

## 2019-03-24 ENCOUNTER — Ambulatory Visit: Payer: No Typology Code available for payment source | Admitting: Physical Therapy

## 2019-04-01 ENCOUNTER — Ambulatory Visit: Payer: No Typology Code available for payment source | Admitting: Physical Therapy

## 2019-04-07 ENCOUNTER — Ambulatory Visit (INDEPENDENT_AMBULATORY_CARE_PROVIDER_SITE_OTHER): Payer: No Typology Code available for payment source | Admitting: Family Medicine

## 2019-04-07 ENCOUNTER — Encounter: Payer: Self-pay | Admitting: Family Medicine

## 2019-04-07 ENCOUNTER — Other Ambulatory Visit: Payer: Self-pay

## 2019-04-07 VITALS — BP 104/64 | HR 60 | Temp 97.1°F | Wt 147.2 lb

## 2019-04-07 DIAGNOSIS — F988 Other specified behavioral and emotional disorders with onset usually occurring in childhood and adolescence: Secondary | ICD-10-CM | POA: Diagnosis not present

## 2019-04-07 MED ORDER — AMPHETAMINE-DEXTROAMPHETAMINE 10 MG PO TABS: 10.0000 mg | ORAL_TABLET | Freq: Two times a day (BID) | ORAL | 0 refills | Status: DC

## 2019-04-07 MED FILL — AMPHETAMINE-DEXTROAMPHETAMI: 10 | 30 days supply | Qty: 60 | Fill #0

## 2019-04-07 NOTE — Progress Notes (Signed)
   Subjective:    Patient ID: Holly Leblanc, female    DOB: Aug 13, 1985, 34 y.o.   MRN: [id number]  HPI Chief Complaint  Patient presents with  . follow-up    doing well on meds needs refill   She is here to follow up on ADD and for renewal of her medication. Reports taking 5 mg 2-3 times per day but lately she has been taking 10 mg and this seems to be more beneficial. Her last dose is typically at 6 pm and she denies any issues with sleeping.  Appetite is good.   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  No new concerns today.   Reviewed allergies, medications, past medical, surgical, family, and social history.    Review of Systems Pertinent positives and negatives in the history of present illness.     Objective:   Physical Exam BP 104/64   Pulse 60   Temp (!) 97.1 F (36.2 C)   Wt 147 lb 3.2 oz (66.8 kg)   BMI 21.74 kg/m   Alert and oriented and in no acute distress.  Not otherwise examined.      Assessment & Plan:  Attention deficit disorder, unspecified hyperactivity presence  She is here to follow-up on ADD and to get refills of her Adderall.  PDMP reviewed and no aberrancies found.  She seems to be doing well.  Reports being in her usual state of health.  I will refill her Adderall for 3 months.  She will return when she is due for her CPE.

## 2019-05-09 MED FILL — AMPHETAMINE-DEXTROAMPHETAMI: 10 | 30 days supply | Qty: 60 | Fill #0

## 2019-05-27 ENCOUNTER — Other Ambulatory Visit: Payer: Self-pay

## 2019-05-27 ENCOUNTER — Other Ambulatory Visit: Payer: Self-pay | Admitting: Family Medicine

## 2019-05-27 ENCOUNTER — Ambulatory Visit (INDEPENDENT_AMBULATORY_CARE_PROVIDER_SITE_OTHER): Payer: Self-pay | Admitting: Plastic Surgery

## 2019-05-27 ENCOUNTER — Encounter: Payer: Self-pay | Admitting: Plastic Surgery

## 2019-05-27 DIAGNOSIS — L719 Rosacea, unspecified: Secondary | ICD-10-CM

## 2019-05-27 NOTE — Progress Notes (Signed)
Preoperative Dx: Rosacea  Postoperative Dx:  same  Procedure: laser to face  Anesthesia: none  Description of Procedure:  Risks and complications were explained to the patient. Consent was confirmed and signed. Time out was called and all information was confirmed to be correct. The area  area was prepped with alcohol and wiped dry. The IPL laser was set at 5 J/cm2. The face was lasered. The patient tolerated the procedure well and there were no complications. The patient is to follow up in 4 weeks.

## 2019-05-30 MED ORDER — ALPRAZOLAM 0.5 MG PO TABS: 0.5000 mg | ORAL_TABLET | Freq: Every day | ORAL | 0 refills | Status: DC | PRN

## 2019-05-30 MED FILL — ALPRAZolam 0.5 MG TABS: 0.5 | 20 days supply | Qty: 20 | Fill #0

## 2019-06-10 MED FILL — AMPHETAMINE-DEXTROAMPHETAMI: 10 | 30 days supply | Qty: 60 | Fill #0

## 2019-07-08 ENCOUNTER — Ambulatory Visit (INDEPENDENT_AMBULATORY_CARE_PROVIDER_SITE_OTHER): Payer: No Typology Code available for payment source

## 2019-07-08 ENCOUNTER — Other Ambulatory Visit: Payer: Self-pay

## 2019-07-08 VITALS — BP 103/60 | HR 63 | Temp 98.3°F | Wt 147.0 lb

## 2019-07-08 DIAGNOSIS — Z872 Personal history of diseases of the skin and subcutaneous tissue: Secondary | ICD-10-CM

## 2019-07-08 DIAGNOSIS — L719 Rosacea, unspecified: Secondary | ICD-10-CM

## 2019-07-08 MED FILL — AMPHETAMINE SALTS 10 MG: 10 | 30 days supply | Qty: 60 | Fill #0

## 2019-07-10 ENCOUNTER — Encounter: Payer: Self-pay | Admitting: Family Medicine

## 2019-07-14 DIAGNOSIS — Z872 Personal history of diseases of the skin and subcutaneous tissue: Secondary | ICD-10-CM | POA: Insufficient documentation

## 2019-07-14 NOTE — Progress Notes (Signed)
Preoperative Dx: facial rosacea  Postoperative Dx:  same  Procedure: laser to face  Anesthesia: EMLA -pt applied  prior to procedure  Description of Procedure:  Risks and complications were explained to the patient. Consent was confirmed and signed. Time out was called and all information was confirmed to be correct. The area  was prepped with alcohol and wiped dry. The ND Yag laser was set at the following:  skin -1 suntan light - -ND Yag was used at 0.1/0.2 measurements  & 4/3- intensity- ND Yag to red vessels on nose- bilaterally Then IPL laser used to bilateral cheek areas for rosacea- Setting: skin 1/sun -light= 5.0 & decreased to 4.6 under both eyes  The patient tolerated the procedure well and there were no complications.  Aloe applied & provided ice for pt to use Pt understands to protect skin with sunscreen & moisturizer & avoid retina products & no abrasive scrubs for approx. 7 to 10 days  She is reminded that she may experience redness/peeling & irritation Patient to f/u in 6 weeks She will call for any concerns [facility]

## 2019-07-14 NOTE — Patient Instructions (Signed)
Pt is reminded to call for any concerns- otherwise she will protect skin from sun & keep moisturized

## 2019-08-01 ENCOUNTER — Encounter: Payer: Self-pay | Admitting: Family Medicine

## 2019-08-01 NOTE — Telephone Encounter (Signed)
We can probably do this with a virtual visit or have him come in

## 2019-08-03 ENCOUNTER — Other Ambulatory Visit: Payer: Self-pay

## 2019-08-03 ENCOUNTER — Telehealth (INDEPENDENT_AMBULATORY_CARE_PROVIDER_SITE_OTHER): Payer: No Typology Code available for payment source | Admitting: Family Medicine

## 2019-08-03 ENCOUNTER — Encounter: Payer: Self-pay | Admitting: Family Medicine

## 2019-08-03 ENCOUNTER — Ambulatory Visit (HOSPITAL_COMMUNITY)
Admission: RE | Admit: 2019-08-03 | Discharge: 2019-08-03 | Disposition: A | Discharge status: Home / Self Care | Payer: No Typology Code available for payment source | Source: Ambulatory Visit | Attending: Family Medicine | Admitting: Family Medicine

## 2019-08-03 VITALS — BP 106/60 | Temp 97.5°F | Wt 147.0 lb

## 2019-08-03 DIAGNOSIS — M79675 Pain in left toe(s): Secondary | ICD-10-CM | POA: Insufficient documentation

## 2019-08-03 NOTE — Progress Notes (Signed)
   Subjective:    Patient ID: Holly Leblanc, female    DOB: 1985/04/16, 34 y.o.   MRN: [id number]  HPI I connected with  Holly Leblanc on 08/03/19 by a video enabled telemedicine application and verified that I am speaking with the correct person using two identifiers. I discussed the limitations of evaluation and management by telemedicine. The patient expressed understanding and agreed to proceed on [place] the video with quite poor.  No other people were present for this encounter. She states that in early June she injured her left fourth toe and treated this conservatively.  She had done fairly well with that until the last week and a half when she noted an increase in the discomfort.  She has kept her busy schedule and does work out but has noticed that she has no altering her normal pattern because of the pain.   Review of Systems     Objective:   Physical Exam Alert and in no distress otherwise not examined       Assessment & Plan:

## 2019-08-05 ENCOUNTER — Other Ambulatory Visit: Payer: Self-pay

## 2019-08-05 DIAGNOSIS — M79675 Pain in left toe(s): Secondary | ICD-10-CM

## 2019-08-10 ENCOUNTER — Other Ambulatory Visit: Payer: Self-pay | Admitting: Family Medicine

## 2019-08-10 DIAGNOSIS — F988 Other specified behavioral and emotional disorders with onset usually occurring in childhood and adolescence: Secondary | ICD-10-CM

## 2019-08-10 NOTE — Telephone Encounter (Signed)
Is this okay to refill? 

## 2019-08-12 ENCOUNTER — Other Ambulatory Visit: Payer: Self-pay | Admitting: Family Medicine

## 2019-08-12 DIAGNOSIS — F988 Other specified behavioral and emotional disorders with onset usually occurring in childhood and adolescence: Secondary | ICD-10-CM

## 2019-08-12 MED FILL — AMPHETAMINE SALTS 10 MG: 10 | 30 days supply | Qty: 60 | Fill #0

## 2019-08-12 NOTE — Telephone Encounter (Signed)
This was done yesterday. Please deny

## 2019-08-12 NOTE — Telephone Encounter (Signed)
Please deny since refilled tomorrow

## 2019-08-15 ENCOUNTER — Ambulatory Visit: Payer: No Typology Code available for payment source | Admitting: Orthopedic Surgery

## 2019-08-16 ENCOUNTER — Encounter: Payer: Self-pay | Admitting: Family

## 2019-08-16 ENCOUNTER — Ambulatory Visit (INDEPENDENT_AMBULATORY_CARE_PROVIDER_SITE_OTHER): Payer: No Typology Code available for payment source | Admitting: Orthopedic Surgery

## 2019-08-16 VITALS — Ht 69.0 in | Wt 147.0 lb

## 2019-08-16 DIAGNOSIS — S92535A Nondisplaced fracture of distal phalanx of left lesser toe(s), initial encounter for closed fracture: Secondary | ICD-10-CM | POA: Diagnosis not present

## 2019-08-16 NOTE — Progress Notes (Signed)
   Office Visit Note   Patient: Holly Leblanc           Date of Birth: 1985/09/20           MRN: [id number] Visit Date: 08/16/2019              Requested by: Ronnald Nian, MD [address] [place],  [place] [address] PCP: Avanell Shackleton, NP-C  Chief Complaint  Patient presents with  . Left Foot - Pain    4th toe injury kicked a weight in June      HPI: Patient is a 34 year old woman who presents status post blunt trauma sustaining a nondisplaced fracture distal phalanx fourth toe left foot.  Patient states she still has pain with increasing her activities.  Assessment & Plan: Visit Diagnoses:  1. Closed nondisplaced fracture of distal phalanx of lesser toe of left foot, initial encounter     Plan: Recommended a stiff soled Trail running sneaker she may increase her activities as tolerated no restrictions.  Follow-Up Instructions: Return if symptoms worsen or fail to improve.   Ortho Exam  Patient is alert, oriented, no adenopathy, well-dressed, normal affect, normal respiratory effort. Examination patient has good range of motion of the fourth toe she has good pulses.  There is a little bit of swelling around the toe there is some mild redness that resolves with massage consistent with the healing process.  She has good ankle and subtalar motion.  The toe was not tender to palpation.  Imaging: No results found. No images are attached to the encounter.  Labs: No results found for: HGBA1C, ESRSEDRATE, CRP, LABURIC, REPTSTATUS, GRAMSTAIN, CULT, LABORGA   No results found for: ALBUMIN, PREALBUMIN, LABURIC  No results found for: MG No results found for: VD25OH  No results found for: PREALBUMIN No flowsheet data found.   Body mass index is 21.71 kg/m.  Orders:  No orders of the defined types were placed in this encounter.  No orders of the defined types were placed in this encounter.    Procedures: No procedures performed  Clinical  Data: No additional findings.  ROS:  All other systems negative, except as noted in the HPI. Review of Systems  Objective: Vital Signs: Ht 5\' 9"  (1.753 m)   Wt 147 lb (66.7 kg)   BMI 21.71 kg/m   Specialty Comments:  No specialty comments available.  PMFS History: Patient Active Problem List   Diagnosis Date Noted  . History of rosacea 07/14/2019  . Rosacea 05/27/2019  . Notalgia 08/11/2018  . Situational anxiety 10/01/2017  . ADD (attention deficit disorder)    Past Medical History:  Diagnosis Date  . ADD (attention deficit disorder)     No family history on file.  Past Surgical History:  Procedure Laterality Date  . TONSILLECTOMY     Social History   Occupational History  . Not on file  Tobacco Use  . Smoking status: Never Smoker  . Smokeless tobacco: Never Used  Substance and Sexual Activity  . Alcohol use: Yes    Alcohol/week: 2.0 - 4.0 standard drinks    Types: 1 - 2 Glasses of wine, 1 - 2 Cans of beer per week  . Drug use: Never  . Sexual activity: Not Currently    Partners: Male    Birth control/protection: Condom, I.U.D.

## 2019-08-22 ENCOUNTER — Other Ambulatory Visit: Payer: Self-pay

## 2019-08-22 ENCOUNTER — Ambulatory Visit (INDEPENDENT_AMBULATORY_CARE_PROVIDER_SITE_OTHER): Payer: Self-pay

## 2019-08-22 VITALS — BP 124/76 | HR 75 | Temp 98.1°F | Ht 69.0 in | Wt 150.0 lb

## 2019-08-22 DIAGNOSIS — Z872 Personal history of diseases of the skin and subcutaneous tissue: Secondary | ICD-10-CM

## 2019-08-30 ENCOUNTER — Encounter: Payer: Self-pay | Admitting: Family Medicine

## 2019-08-31 ENCOUNTER — Other Ambulatory Visit (HOSPITAL_COMMUNITY): Payer: Self-pay | Admitting: Family Medicine

## 2019-08-31 ENCOUNTER — Encounter: Payer: Self-pay | Admitting: Family Medicine

## 2019-08-31 ENCOUNTER — Ambulatory Visit (INDEPENDENT_AMBULATORY_CARE_PROVIDER_SITE_OTHER): Payer: No Typology Code available for payment source | Admitting: Family Medicine

## 2019-08-31 ENCOUNTER — Other Ambulatory Visit: Payer: Self-pay

## 2019-08-31 VITALS — BP 110/60 | HR 63 | Temp 97.9°F | Wt 150.8 lb

## 2019-08-31 DIAGNOSIS — Z113 Encounter for screening for infections with a predominantly sexual mode of transmission: Secondary | ICD-10-CM

## 2019-08-31 DIAGNOSIS — N898 Other specified noninflammatory disorders of vagina: Secondary | ICD-10-CM | POA: Diagnosis not present

## 2019-08-31 DIAGNOSIS — F988 Other specified behavioral and emotional disorders with onset usually occurring in childhood and adolescence: Secondary | ICD-10-CM

## 2019-08-31 DIAGNOSIS — B373 Candidiasis of vulva and vagina: Secondary | ICD-10-CM | POA: Diagnosis not present

## 2019-08-31 DIAGNOSIS — B3731 Acute candidiasis of vulva and vagina: Secondary | ICD-10-CM

## 2019-08-31 LAB — POCT WET PREP (WET MOUNT)
Clue Cells Wet Prep Whiff POC: NEGATIVE
Trichomonas Wet Prep HPF POC: ABSENT

## 2019-08-31 LAB — POCT URINE PREGNANCY: Preg Test, Ur: NEGATIVE

## 2019-08-31 MED ORDER — AMPHETAMINE-DEXTROAMPHETAMINE 10 MG PO TABS: ORAL_TABLET | ORAL | 0 refills | Status: DC

## 2019-08-31 MED ORDER — FLUCONAZOLE 150 MG PO TABS: 150.0000 mg | ORAL_TABLET | Freq: Once | ORAL | 1 refills | Status: AC

## 2019-08-31 MED ORDER — AMPHETAMINE-DEXTROAMPHETAMINE 10 MG PO TABS: 10.0000 mg | ORAL_TABLET | Freq: Two times a day (BID) | ORAL | 0 refills | Status: DC

## 2019-08-31 MED FILL — FLUCONAZOLE 150 MG TABS: 150 | 1 days supply | Qty: 1 | Fill #0

## 2019-08-31 NOTE — Progress Notes (Signed)
° °  Subjective:    Patient ID: Holly Leblanc, female    DOB: 10-17-1985, 34 y.o.   MRN: [id number]  HPI Chief Complaint  Patient presents with   yeast infection    yeast infection- 3 weeks- itchy, discomfort, white/clear discharge, no odor   Complains of a 3 week h/o vaginal itching and thick, clumpy white discharge.  No odor. She would like to be tested for STDs.  Declines HIV or RPR.  Denies fever, chills, abdominal pain, back pain, nausea, vomiting or urinary symptoms.  Requests refill of Adderall.  States she is taking 10 mg twice daily and doing well.  No concerns.  Requests pregnancy test to ensure she is not pregnant.   Review of Systems Pertinent positives and negatives in the history of present illness.     Objective:   Physical Exam Exam conducted with a chaperone present.  Constitutional:      General: She is not in acute distress.    Appearance: Normal appearance. She is not ill-appearing.  Genitourinary:    Labia:        Right: No rash, tenderness or lesion.        Left: No rash, tenderness or lesion.      Vagina: Vaginal discharge present.     Comments: Speculum exam performed only. Thick, white and clumpy discharge in vaginal vault.  Neurological:     Mental Status: She is alert.    BP 110/60    Pulse 63    Temp 97.9 F (36.6 C)    Wt 150 lb 12.8 oz (68.4 kg)    BMI 22.27 kg/m         Assessment & Plan:  Candidiasis of vagina - Plan: fluconazole (DIFLUCAN) 150 MG tablet, POCT Wet Prep (Wet [place])  Vaginal itching - Plan: fluconazole (DIFLUCAN) 150 MG tablet, POCT urine pregnancy, NuSwab Vaginitis Plus (VG+), POCT Wet Prep [facility])  Attention deficit disorder, unspecified hyperactivity presence - Plan: amphetamine-dextroamphetamine (ADDERALL) 10 MG tablet, amphetamine-dextroamphetamine (ADDERALL) 10 MG tablet  Screen for STD (sexually transmitted disease) - Plan: POCT urine pregnancy  UPT negative.  Wet prep +yeast. Negative trich  and BV Treat with diflucan.  Nuswab ordered to screen for STDs. She declines HIV, RPR ADD- doing well on current dose of Adderall and seems to be taking it appropriately without side effects.  PDMP reviewed.

## 2019-09-05 LAB — NUSWAB VAGINITIS PLUS (VG+)
Candida albicans, NAA: POSITIVE — AB
Candida glabrata, NAA: NEGATIVE
Chlamydia trachomatis, NAA: NEGATIVE
Neisseria gonorrhoeae, NAA: NEGATIVE
Trich vag by NAA: NEGATIVE

## 2019-09-06 NOTE — Progress Notes (Signed)
Preoperative Dx: facial aging  Postoperative Dx:  same  Procedure: laser to face  Anesthesia: EMLA -pt applied 40 mins prior to procedure  Description of Procedure:  Risks and complications were explained to the patient. Consent was confirmed and signed. Time out was called and all information was confirmed to be correct. Pre-procedure photos were obtained & entered into chart  The area was prepped with alcohol and wiped dry.  The ND Yag laser was set at the following:  skin -1/ suntan light [place]/red/0.1 vessels= 209.4J Nose & upper cheek areas were treated with ND YAG IPL (PR530)- was used on upper cheek area for redness Settings: skin-1/sun-0 = 5.0J/cm2 Pt tolerated the entire laser procedure without any difficulty Aloe gel applied to areas & pt is reminded to protect skin from sun/retinol products & keep moisturized She will call for any concerns F/u in 4-6 weeks if needed  The patient tolerated the procedure well and there were no complications.  Aloe applied & provided ice for pt to use She is reminded to protect skin with sunscreen & moisturizer & avoid retina products & no abrasive scrubs for approx. 7 to 10 days  She is reminded that she may experience redness/peeling & irritation Patient to f/u in 4- 6 weeks She will call for any concerns [facility]

## 2019-09-06 NOTE — Patient Instructions (Signed)
Protect skin from sun- use sunscreen/hat Moisturize area  F/u in 4-6 weeks if needed & call for any concerns

## 2019-09-13 MED FILL — AMPHETAMINE SALTS 10 MG: 10 | 30 days supply | Qty: 60 | Fill #0

## 2019-09-21 ENCOUNTER — Encounter: Payer: Self-pay | Admitting: Family Medicine

## 2019-09-21 DIAGNOSIS — G8929 Other chronic pain: Secondary | ICD-10-CM

## 2019-09-21 DIAGNOSIS — M25511 Pain in right shoulder: Secondary | ICD-10-CM

## 2019-10-07 ENCOUNTER — Encounter: Payer: Self-pay | Admitting: Family Medicine

## 2019-10-07 ENCOUNTER — Ambulatory Visit (INDEPENDENT_AMBULATORY_CARE_PROVIDER_SITE_OTHER): Payer: No Typology Code available for payment source | Admitting: Family Medicine

## 2019-10-07 ENCOUNTER — Other Ambulatory Visit: Payer: Self-pay

## 2019-10-07 VITALS — BP 120/72 | HR 91 | Ht 69.75 in | Wt 149.8 lb

## 2019-10-07 DIAGNOSIS — Z0000 Encounter for general adult medical examination without abnormal findings: Secondary | ICD-10-CM

## 2019-10-07 DIAGNOSIS — I7300 Raynaud's syndrome without gangrene: Secondary | ICD-10-CM | POA: Insufficient documentation

## 2019-10-07 DIAGNOSIS — F988 Other specified behavioral and emotional disorders with onset usually occurring in childhood and adolescence: Secondary | ICD-10-CM

## 2019-10-07 DIAGNOSIS — F418 Other specified anxiety disorders: Secondary | ICD-10-CM

## 2019-10-07 NOTE — Progress Notes (Signed)
Subjective:    Patient ID: Holly Leblanc, female    DOB: 10-01-1985, 34 y.o.   MRN: [id number]  HPI Chief Complaint  Patient presents with   cpe    nonfasting cpe, last pap 2020   She is here for a complete physical exam.  Other providers: Sports medicine- Dr. Margaretha Sheffield   Chronic trapezius and back issues and will see sports medicine for this.  Intermittent constipation that fiber works well to control.   ADD- doing fine on medication.    Social history: Lives alone, works in [employer] program. 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  Diet: healthy  Excerise: 6 days per week   Immunizations: [facility]   Health maintenance:  Mammogram: early 78s, benign  Colonoscopy: N/A Last Gynecological Exam: colposcopy 2020. Negative HPV  Last Menstrual cycle: 10/04/2019 this one is 2 weeks early and heavier  Last Dental Exam: this year  Last Eye Exam: overdue   Wears seatbelt always, uses sunscreen, smoke detectors in home and functioning, does not text while driving and feels safe in home environment.   Reviewed allergies, medications, past medical, surgical, family, and social history.   Review of Systems Review of Systems Constitutional: -fever, -chills, -sweats, -unexpected weight change,-fatigue ENT: -runny nose, -ear pain, -sore throat Cardiology:  -chest pain, -palpitations, -edema Respiratory: -cough, -shortness of breath, -wheezing Gastroenterology: -abdominal pain, -nausea, -vomiting, -diarrhea, -constipation  Hematology: -bleeding or bruising problems Musculoskeletal: -arthralgias, -myalgias, -joint swelling, -back pain Ophthalmology: -vision changes Urology: -dysuria, -difficulty urinating, -hematuria, -urinary frequency, -urgency Neurology: -headache, -weakness, -tingling, -numbness       Objective:   Physical Exam BP 120/72    Pulse 91    Ht 5' 9.75" (1.772 m)    Wt 149 lb 12.8 oz (67.9 kg)     LMP 10/04/2019    BMI 21.65 kg/m   General Appearance:    Alert, cooperative, no distress, appears stated age  Head:    Normocephalic, without obvious abnormality, atraumatic  Eyes:    PERRL, conjunctiva/corneas clear, EOM's intact  Ears:    Normal TM's and external ear canals  Nose:   Mask on   Throat:   Mask on   Neck:   Supple, no lymphadenopathy;  thyroid:  no   enlargement/tenderness/nodules; no JVD  Back:    Spine nontender, no curvature, ROM normal, no CVA     tenderness  Lungs:     Clear to auscultation bilaterally without wheezes, rales or     ronchi; respirations unlabored  Chest Wall:    No tenderness or deformity   Heart:    Regular rate and rhythm, S1 and S2 normal, no murmur, rub   or gallop  Breast Exam:    Not done. OB/GYN.  No axillary lymphadenopathy  Abdomen:     Soft, non-tender, nondistended, normoactive bowel sounds,    no masses, no hepatosplenomegaly  Genitalia:    OB/GYN  Rectal:    Not performed due to age<40 and no related complaints  Extremities:   No clubbing, cyanosis or edema  Pulses:   2+ and symmetric all extremities  Skin:   Skin color, texture, turgor normal, no rashes or lesions  Lymph nodes:   Cervical, supraclavicular, and axillary nodes normal  Neurologic:   CNII-XII intact, normal strength, sensation and gait          Psych:   Normal mood, affect, hygiene and grooming.  Assessment & Plan:  Routine general medical examination at a health care facility  Situational anxiety  Attention deficit disorder, unspecified hyperactivity presence  She is here today for her CPE.  Declines labs.  She will drop off recent labs done elsewhere. She is in good spirits.  No new concerns today. Preventive health care reviewed as well as immunizations.  She is welcome to get her Pfizer Covid booster vaccine here if she would like.  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  I cannot see these.  She appears to be taking good care  of herself and has a healthy lifestyle.  Discussed safety and health promotion.  She will call and schedule a dental exam.  Follow-up with OB/GYN when due.  Follow-up with me for ADD.  No refills needed today.

## 2019-10-07 NOTE — Patient Instructions (Signed)
Preventive Care 21-35 Years Old, Female Preventive care refers to visits with your health care provider and lifestyle choices that can promote health and wellness. This includes:  A yearly physical exam. This may also be called an annual well check.  Regular dental visits and eye exams.  Immunizations.  Screening for certain conditions.  Healthy lifestyle choices, such as eating a healthy diet, getting regular exercise, not using drugs or products that contain nicotine and tobacco, and limiting alcohol use. What can I expect for my preventive care visit? Physical exam Your health care provider will check your:  Height and weight. This may be used to calculate body mass index (BMI), which tells if you are at a healthy weight.  Heart rate and blood pressure.  Skin for abnormal spots. Counseling Your health care provider may ask you questions about your:  Alcohol, tobacco, and drug use.  Emotional well-being.  Home and relationship well-being.  Sexual activity.  Eating habits.  Work and work environment.  Method of birth control.  Menstrual cycle.  Pregnancy history. What immunizations do I need?  Influenza (flu) vaccine  This is recommended every year. Tetanus, diphtheria, and pertussis (Tdap) vaccine  You may need a Td booster every 10 years. Varicella (chickenpox) vaccine  You may need this if you have not been vaccinated. Human papillomavirus (HPV) vaccine  If recommended by your health care provider, you may need three doses over 6 months. Measles, mumps, and rubella (MMR) vaccine  You may need at least one dose of MMR. You may also need a second dose. Meningococcal conjugate (MenACWY) vaccine  One dose is recommended if you are age 19-21 years and a first-year college student living in a residence hall, or if you have one of several medical conditions. You may also need additional booster doses. Pneumococcal conjugate (PCV13) vaccine  You may need  this if you have certain conditions and were not previously vaccinated. Pneumococcal polysaccharide (PPSV23) vaccine  You may need one or two doses if you smoke cigarettes or if you have certain conditions. Hepatitis A vaccine  You may need this if you have certain conditions or if you travel or work in places where you may be exposed to hepatitis A. Hepatitis B vaccine  You may need this if you have certain conditions or if you travel or work in places where you may be exposed to hepatitis B. Haemophilus influenzae type b (Hib) vaccine  You may need this if you have certain conditions. You may receive vaccines as individual doses or as more than one vaccine together in one shot (combination vaccines). Talk with your health care provider about the risks and benefits of combination vaccines. What tests do I need?  Blood tests  Lipid and cholesterol levels. These may be checked every 5 years starting at age 20.  Hepatitis C test.  Hepatitis B test. Screening  Diabetes screening. This is done by checking your blood sugar (glucose) after you have not eaten for a while (fasting).  Sexually transmitted disease (STD) testing.  BRCA-related cancer screening. This may be done if you have a family history of breast, ovarian, tubal, or peritoneal cancers.  Pelvic exam and Pap test. This may be done every 3 years starting at age 21. Starting at age 30, this may be done every 5 years if you have a Pap test in combination with an HPV test. Talk with your health care provider about your test results, treatment options, and if necessary, the need for more tests.   Follow these instructions at home: Eating and drinking   Eat a diet that includes fresh fruits and vegetables, whole grains, lean protein, and low-fat dairy.  Take vitamin and mineral supplements as recommended by your health care provider.  Do not drink alcohol if: ? Your health care provider tells you not to drink. ? You are  pregnant, may be pregnant, or are planning to become pregnant.  If you drink alcohol: ? Limit how much you have to 0-1 drink a day. ? Be aware of how much alcohol is in your drink. In the [place] one drink equals one 12 oz bottle of beer (355 mL), one 5 oz glass of wine (148 mL), or one 1 oz glass of hard liquor (44 mL). Lifestyle  Take daily care of your teeth and gums.  Stay active. Exercise for at least 30 minutes on 5 or more days each week.  Do not use any products that contain nicotine or tobacco, such as cigarettes, e-cigarettes, and chewing tobacco. If you need help quitting, ask your health care provider.  If you are sexually active, practice safe sex. Use a condom or other form of birth control (contraception) in order to prevent pregnancy and STIs (sexually transmitted infections). If you plan to become pregnant, see your health care provider for a preconception visit. What's next?  Visit your health care provider once a year for a well check visit.  Ask your health care provider how often you should have your eyes and teeth checked.  Stay up to date on all vaccines. This information is not intended to replace advice given to you by your health care provider. Make sure you discuss any questions you have with your health care provider. Document Revised: 09/03/2017 Document Reviewed: 09/03/2017 Elsevier Patient Education  2020 [employer].

## 2019-10-13 MED FILL — AMPHETAMINE SALTS 10 MG: 10 | 30 days supply | Qty: 60 | Fill #0

## 2019-10-26 ENCOUNTER — Encounter: Payer: Self-pay | Admitting: Family Medicine

## 2019-10-27 ENCOUNTER — Ambulatory Visit: Payer: No Typology Code available for payment source

## 2019-10-28 ENCOUNTER — Ambulatory Visit (INDEPENDENT_AMBULATORY_CARE_PROVIDER_SITE_OTHER): Payer: Self-pay

## 2019-10-28 ENCOUNTER — Other Ambulatory Visit: Payer: Self-pay

## 2019-10-28 VITALS — BP 110/71 | HR 79 | Temp 98.1°F | Ht 69.75 in | Wt 148.0 lb

## 2019-10-28 DIAGNOSIS — Z719 Counseling, unspecified: Secondary | ICD-10-CM

## 2019-10-28 NOTE — Patient Instructions (Signed)
Pt is reminded to protect skin from sun/avoid retina products & use moisturizer as needed. She will call for any concerns F/U 6 weeks for next treatment plan.

## 2019-10-28 NOTE — Progress Notes (Signed)
Preoperative Dx: facial rosacea/hyperpigmentation  Postoperative Dx:  same  Procedure: laser to face  Anesthesia: none  Description of Procedure:  Dr. Ulice Bold consulted with pt regarding her concern with the hyperpigmentation/rosacea area on her upper cheek/face area & she also  continues to have redness/teleangiectasia on her nose. Dr. Ulice Bold suggested  ND Yag for nose & IPL for cheek areas. Photos were obtained & entered into chart.  Risks and complications were explained to the patient. Consent was confirmed and signed. Time out was called and all information was confirmed to be correct. The area  was prepped with alcohol and wiped dry.  The IPL laser was set at the following:  skin -1/ suntan light -  6.4J/cm2- upper cheek/face lasered ND Yag laser set at the following: Skin 1/ suntan light= red vessels=0.2-0.3 size setting: 235.8/1/1  patient tolerated the procedure well and there were no complications.  Aloe applied  She is reminded to protect skin with sunscreen & moisturizer & avoid retina products & no abrasive scrubs for approx. 7 to 10 days   appointment  She will call for any concerns [facility]

## 2019-11-13 ENCOUNTER — Other Ambulatory Visit: Payer: Self-pay | Admitting: Family Medicine

## 2019-11-13 DIAGNOSIS — F988 Other specified behavioral and emotional disorders with onset usually occurring in childhood and adolescence: Secondary | ICD-10-CM

## 2019-11-14 ENCOUNTER — Other Ambulatory Visit (HOSPITAL_COMMUNITY): Payer: Self-pay | Admitting: Family Medicine

## 2019-11-14 MED ORDER — AMPHETAMINE-DEXTROAMPHETAMINE 10 MG PO TABS: 10.0000 mg | ORAL_TABLET | Freq: Two times a day (BID) | ORAL | 0 refills | Status: DC

## 2019-11-14 MED FILL — AMPHETAMINE SALTS 10 MG: 10 | 30 days supply | Qty: 60 | Fill #0

## 2019-11-14 NOTE — Telephone Encounter (Signed)
Is this okay to refill? 

## 2019-11-15 ENCOUNTER — Ambulatory Visit (INDEPENDENT_AMBULATORY_CARE_PROVIDER_SITE_OTHER): Payer: No Typology Code available for payment source

## 2019-11-15 ENCOUNTER — Other Ambulatory Visit: Payer: Self-pay

## 2019-11-15 DIAGNOSIS — Z23 Encounter for immunization: Secondary | ICD-10-CM | POA: Diagnosis not present

## 2019-12-06 ENCOUNTER — Encounter: Payer: Self-pay | Admitting: Plastic Surgery

## 2019-12-06 ENCOUNTER — Encounter: Payer: Self-pay | Admitting: Family Medicine

## 2019-12-07 NOTE — Telephone Encounter (Signed)
Virtual is fine. If she is going to run out before we can do this, I am happy to give her a one month refill.

## 2019-12-09 ENCOUNTER — Other Ambulatory Visit: Payer: Self-pay | Admitting: Internal Medicine

## 2019-12-09 ENCOUNTER — Other Ambulatory Visit (HOSPITAL_COMMUNITY): Payer: Self-pay | Admitting: Internal Medicine

## 2019-12-09 DIAGNOSIS — H109 Unspecified conjunctivitis: Secondary | ICD-10-CM

## 2019-12-09 MED ORDER — CIPROFLOXACIN HCL 0.3 % OP SOLN: OPHTHALMIC | 0 refills | Status: DC

## 2019-12-09 MED ORDER — CIPROFLOXACIN HCL 0.3 % OP OINT: TOPICAL_OINTMENT | OPHTHALMIC | 0 refills | Status: DC

## 2019-12-09 MED FILL — CIPROFLOXACIN 0.3% EYE DRO: 0.3 | 7 days supply | Qty: 5 | Fill #0

## 2019-12-09 NOTE — Progress Notes (Signed)
Received call from patient due to concern for bacterial conjunctivitis. She is a contact lense wearer and left her contacts in their case with solution for two days. After wearing the contacts she developed worsening / progressive pain bilaterally and now purulent discharge in her left eye. I have sent a prescription for ciprofloxacin eye drops to her pharmacy.  She does not have a local ophthalmologist. She knows to go to the ER if symptoms worsen or she develops vision loss to evaluate for keratitis.

## 2019-12-12 ENCOUNTER — Other Ambulatory Visit: Payer: Self-pay

## 2019-12-12 ENCOUNTER — Encounter: Payer: Self-pay | Admitting: Family Medicine

## 2019-12-12 ENCOUNTER — Telehealth (INDEPENDENT_AMBULATORY_CARE_PROVIDER_SITE_OTHER): Payer: No Typology Code available for payment source | Admitting: Family Medicine

## 2019-12-12 ENCOUNTER — Other Ambulatory Visit (HOSPITAL_COMMUNITY): Payer: Self-pay | Admitting: Family Medicine

## 2019-12-12 VITALS — Wt 145.0 lb

## 2019-12-12 DIAGNOSIS — F988 Other specified behavioral and emotional disorders with onset usually occurring in childhood and adolescence: Secondary | ICD-10-CM | POA: Diagnosis not present

## 2019-12-12 MED ORDER — AMPHETAMINE-DEXTROAMPHETAMINE 10 MG PO TABS: 10.0000 mg | ORAL_TABLET | Freq: Every day | ORAL | 0 refills | Status: DC | PRN

## 2019-12-12 MED ORDER — AMPHETAMINE-DEXTROAMPHET ER 10 MG PO CP24: 10.0000 mg | ORAL_CAPSULE | Freq: Every day | ORAL | 0 refills | Status: DC

## 2019-12-12 MED FILL — ADDERALL XR 10 MG CAP SA: 10 | 30 days supply | Qty: 30 | Fill #0

## 2019-12-12 MED FILL — AMPHETAMINE SALTS 10 MG: 10 | 30 days supply | Qty: 30 | Fill #0

## 2019-12-12 NOTE — Progress Notes (Signed)
   Subjective:  Documentation for virtual audio and video telecommunications through Caregility encounter: Video did not allow me to see her face, there were squiggly lines across the screen that could not be fixed.  The patient was located at home. 2 patient identifiers used.  The provider was located in the office. The patient did consent to this visit and is aware of possible charges through their insurance for this visit.  The other persons participating in this telemedicine service were none. Time spent on call was 15 minutes and in review of previous records 20 minutes total.  This virtual service is not related to other E/M service within previous 7 days.   Patient ID: Holly Leblanc, female    DOB: July 20, 1985, 34 y.o.   MRN: [id number]  HPI Chief Complaint  Patient presents with  . med check    med check-    This is a follow up on ADD and current Adderall dose.  Reports taking Adderall 5 or 10 mg some days and the 10 mg works better.  States she thinks she needs a longer acting dose. Takes her first Adderall dose  around 7:00 am and it lasts until 10 or 10:30 am. Takes her 2nd dose around 11 am and it lasts for another 3 hours. She often needs to be alert well into the evening. 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  Denies any issues with decreased appetite or sleep with the Adderall.  No other concerns.  Reviewed allergies, medications, past medical, surgical, family, and social history.   Review of Systems Pertinent positives and negatives in the history of present illness.     Objective:   Physical Exam Wt 145 lb (65.8 kg)   BMI 20.95 kg/m   Alert and oriented and in no acute distress.  Speaking in complete sentence without difficulty.  Normal speech, mood and thought process.      Assessment & Plan:  Attention deficit disorder, unspecified hyperactivity presence - Plan: amphetamine-dextroamphetamine (ADDERALL XR) 10 MG 24 hr  capsule, amphetamine-dextroamphetamine (ADDERALL) 10 MG tablet  PDMP reviewed and last refill on 11/14/2019  Discussed that she is metabolizing the medication at a slightly quicker rate so we will try Adderall XR 10 mg in the morning and then I will also prescribe immediate release Adderall 10 mg for her to take in the afternoon or early evening if needed.  She will follow-up with me in 1 week and let me know how she is doing.

## 2019-12-16 ENCOUNTER — Other Ambulatory Visit: Payer: Self-pay

## 2019-12-16 ENCOUNTER — Ambulatory Visit (INDEPENDENT_AMBULATORY_CARE_PROVIDER_SITE_OTHER): Payer: Self-pay

## 2019-12-16 DIAGNOSIS — Z719 Counseling, unspecified: Secondary | ICD-10-CM

## 2020-01-10 ENCOUNTER — Other Ambulatory Visit: Payer: Self-pay | Admitting: Family Medicine

## 2020-01-10 ENCOUNTER — Encounter: Payer: Self-pay | Admitting: Family Medicine

## 2020-01-10 DIAGNOSIS — F988 Other specified behavioral and emotional disorders with onset usually occurring in childhood and adolescence: Secondary | ICD-10-CM

## 2020-01-10 NOTE — Telephone Encounter (Signed)
Ok to refill 

## 2020-01-11 ENCOUNTER — Other Ambulatory Visit: Payer: Self-pay | Admitting: Family Medicine

## 2020-01-11 ENCOUNTER — Other Ambulatory Visit (HOSPITAL_COMMUNITY): Payer: Self-pay | Admitting: Family Medicine

## 2020-01-11 DIAGNOSIS — F988 Other specified behavioral and emotional disorders with onset usually occurring in childhood and adolescence: Secondary | ICD-10-CM

## 2020-01-11 MED ORDER — AMPHETAMINE-DEXTROAMPHET ER 10 MG PO CP24: 10.0000 mg | ORAL_CAPSULE | Freq: Every day | ORAL | 0 refills | Status: DC

## 2020-01-11 MED ORDER — AMPHETAMINE-DEXTROAMPHETAMINE 10 MG PO TABS: 10.0000 mg | ORAL_TABLET | Freq: Every day | ORAL | 0 refills | Status: DC | PRN

## 2020-01-11 MED FILL — AMPHETAMINE SALTS 10 MG: 10 | 30 days supply | Qty: 30 | Fill #0

## 2020-01-11 MED FILL — ADDERALL XR 10 MG CAP SA: 10 | 30 days supply | Qty: 30 | Fill #0

## 2020-01-17 ENCOUNTER — Other Ambulatory Visit: Payer: Self-pay

## 2020-01-19 ENCOUNTER — Ambulatory Visit (INDEPENDENT_AMBULATORY_CARE_PROVIDER_SITE_OTHER): Payer: No Typology Code available for payment source | Admitting: Sports Medicine

## 2020-01-19 ENCOUNTER — Other Ambulatory Visit: Payer: Self-pay

## 2020-01-19 ENCOUNTER — Encounter: Payer: Self-pay | Admitting: Family Medicine

## 2020-01-19 VITALS — BP 120/76 | Ht 69.0 in | Wt 150.0 lb

## 2020-01-19 DIAGNOSIS — G8929 Other chronic pain: Secondary | ICD-10-CM | POA: Diagnosis not present

## 2020-01-19 DIAGNOSIS — M25512 Pain in left shoulder: Secondary | ICD-10-CM | POA: Diagnosis not present

## 2020-01-19 DIAGNOSIS — M25552 Pain in left hip: Secondary | ICD-10-CM | POA: Diagnosis not present

## 2020-01-19 NOTE — Patient Instructions (Signed)
Your pain is likely coming from your latissimus dorsi muscle- which may have become irritated due to the irregular movement in your scapula.  Call Dr Chyrl Civatte (Physical Therapist) to schedule an intake appointment, and we would like to see you back in 4-6 weeks to see how you're doing. In the meanwhile, try sitting on 'donut' pads to help take the pressure off your ischium if you know you'll be seated for awhile. You can also pull your left knee to your chest to help stretch the back of your hip. Dr Chyrl Civatte can help evaluate this area as well.   Thank you for trusting [place] with your care!

## 2020-01-19 NOTE — Progress Notes (Signed)
Office Visit Note   Patient: Holly Leblanc           Date of Birth: 12-18-1985           MRN: [id number] Visit Date: 01/19/2020 Requested by: Avanell Shackleton, NP-C [id number] [place],  [place] [address] PCP: Avanell Shackleton, NP-C  Subjective: CC: Left Shoulder/Scapular Pain, Left Ischial/Gluteal Pain  HPI: 35 year old female presenting to clinic today with concerns of "years" of left shoulder/scapular pain as well as 3 years of left gluteal/ischial pain.  Patient states that ever since she was a child she has noticed she has winging of her left scapula when she rotates her arm, this has never caused her any symptoms previously.  She is not sure what she did to provoke the symptoms that she is currently having, however she does state that she does martial arts as well as numerous at home fitness classes which can get quite rigorous.  She describes her scapular pain as being throughout the lower aspect of her shoulder, and radiating down the left side of her back.  She briefly saw physical therapy, but was displeased with the evaluation appointment and never went back.  She was given some home exercises to do, with a focus of scapular stabilization.  Now, she states her pain is most noticeable when she is trying to do side planks, and she feels significantly weaker on her left side than on her right.  She also says she has pain when trying to stretch her arms over her head, and feels as though she is not able to stretch her left as far she can her right.  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  Pain is focused primarily over the left ischial tuberosity, and hurts significantly with sitting.  There are times when it will also hurt when she is laying flat on her back, and it will rarely radiate up until her glutes or down into her thigh.  She denies any pain with hamstring exercises or other activities in martial arts.  She does say  that she will occasionally get discomfort in her hips with doing crescent type kicks, but this is not similar to the pain she feels while sitting.              ROS:   All other systems were reviewed and are negative.  Objective: Vital Signs: BP 120/76   Ht 5\' 9"  (1.753 m)   Wt 150 lb (68 kg)   BMI 22.15 kg/m   Physical Exam:  General:  Alert and oriented, in no acute distress. Pulm:  Breathing unlabored. Psy:  Normal mood, congruent affect. Skin: Bilateral shoulders with no bruising, rashes, or erythema.  Overlying skin intact. Left Shoulder Exam:  Inspection: Symmetric muscle mass, no atrophy or deformity, no scars. Symmetrical scapular movement with arm abduction and forward flexion. Trace winging on the left with leaning against wall. This becomes significant with extremes of internal rotation of the left arm. No winging appreciated on right.  Palpation: No tenderness to palpation over and around the acromion, over the [facility] joint or biceps insertion. Mild tenderness within rhomboids on left. Endorses Lat Dorsi tenderpoint within axilla.   Range of motion: Full range of motion in forward flexion, abduction and extension.  Full external and internal rotation bilaterally without pain.  Rotator cuff testing:  Full strength and no pain with empty can (supraspinatus).  External rotation with full strength  and no pain. Full strength and no pain with Napoleon and lift off.  AC joint testing: No AC tenderness to palpation, negative scarf test. Negative active compression test.  Biceps testing: Negative speeds, negative Yergason.  Labral testing: O'Brien's/speeds with full strength and no pain. Biceps load II: Negative Crank test: Negative  Impingement testing: No pain with Neer's or Hawkins  Instability: No sulcus sign Load-and-shift without multidirectional instability.  Strength testing:  5 out of 5 strength with shoulder abduction (C5), wrist extension (C6), wrist flexion  (C7), grip strength (C8), and finger abduction (T1).  Sensation: Intact to light touch throughout bilateral upper extremities.   Brisk distal capillary refill.  Left hip:  Normal gait.  No varus or valgus deformity of the knee, appropriate Q angle of the hip.  Left ASIS approx 1cm higher than right, though medial malleoli symmetric when supine.   Full range of motion of hip with no pain.    Palpation:  Endorses tenderness to palpation of left ischial tuberosity. This is NOT worsened with resisted flexion of the hamstrings at 90* or at 30*. No tenderness to palpation over the greater trochanteric area.  No tenderness over the piriformis, or gluteal musculature.  No tenderness over bilateral SI joints.    Supine exam: No pain with logroll.  FADIR produces some anterior hip pain. FABER produces no posterior or groin pain.    Imaging: No results found.  Assessment & Plan: 35 year old female presenting to clinic today with concerns of several years of left scapular/shoulder pain as well as left gluteal/ischial pain.  On examination today, suspected primary source of her left shoulder pain is within the latissimus dorsi likely due to underlying scapular dysmobility as evidenced by winging on examination.  Additionally, tenderness to palpation over ischial tuberosity raises concern for possible ischial bursitis versus proximal hamstring tendinitis. -Patient is interested in trying formal physical therapy, will place referral to Dr. Cecille Amsterdam for assistance with evaluation and treatment. -Discussed simple home exercises she can perform while awaiting her physical therapy intake appointment. -Return to clinic in 4 to 6 weeks. -Patient expresses understanding and has no further questions or concerns today.   Patient seen and evaluated with the sports medicine fellow.  I agree with the above plan of care.  I would like for Dr. Cleaster Corin to work with Ellamae Sia and follow-up with me again in 4 to  6 weeks.  She appears to have some scapular dyskinesis as well as some ischial tuberosity bursitis.  Hopefully she will be able to wean to a home exercise program quickly.

## 2020-01-20 ENCOUNTER — Encounter: Payer: Self-pay | Admitting: Plastic Surgery

## 2020-01-20 ENCOUNTER — Other Ambulatory Visit: Payer: Self-pay

## 2020-01-20 ENCOUNTER — Ambulatory Visit (INDEPENDENT_AMBULATORY_CARE_PROVIDER_SITE_OTHER): Payer: Self-pay | Admitting: Plastic Surgery

## 2020-01-20 DIAGNOSIS — Z719 Counseling, unspecified: Secondary | ICD-10-CM | POA: Insufficient documentation

## 2020-01-20 NOTE — Progress Notes (Signed)
Preoperative Dx: Facial redness and telangiectasias  Postoperative Dx:  same  Procedure: laser to face  Anesthesia: none  Description of Procedure:  Risks and complications were explained to the patient. Consent was confirmed and signed. Time out was called and all information was confirmed to be correct. The area  area was prepped with alcohol and wiped dry. The IPL laser was set at 7.4 J/cm2. The face was lasered. The patient tolerated the procedure well and there were no complications. The patient is to follow up in 4 weeks.

## 2020-01-27 NOTE — Progress Notes (Signed)
Laser session cancelled d/t scheduling conflict in office We will reschedule her for a later date.

## 2020-02-01 ENCOUNTER — Other Ambulatory Visit: Payer: Self-pay | Admitting: Family Medicine

## 2020-02-01 ENCOUNTER — Other Ambulatory Visit (HOSPITAL_COMMUNITY): Payer: Self-pay | Admitting: Family Medicine

## 2020-02-01 ENCOUNTER — Encounter: Payer: Self-pay | Admitting: Family Medicine

## 2020-02-01 DIAGNOSIS — F988 Other specified behavioral and emotional disorders with onset usually occurring in childhood and adolescence: Secondary | ICD-10-CM

## 2020-02-01 MED ORDER — AMPHETAMINE-DEXTROAMPHETAMINE 10 MG PO TABS: 10.0000 mg | ORAL_TABLET | Freq: Two times a day (BID) | ORAL | 0 refills | Status: DC

## 2020-02-01 NOTE — Progress Notes (Signed)
   Subjective:    Patient ID: Holly Leblanc, female    DOB: 23-Apr-1985, 35 y.o.   MRN: [id number]  HPI  Prefers to go back to her original Adderall 10 mg tablets bid dosing and stop the Adderall. XR.  She will follow up in 4 weeks or sooner if needed.   Review of Systems     Objective:   Physical Exam        Assessment & Plan:

## 2020-02-07 ENCOUNTER — Telehealth: Payer: Self-pay

## 2020-02-07 DIAGNOSIS — G8929 Other chronic pain: Secondary | ICD-10-CM

## 2020-02-07 DIAGNOSIS — M25552 Pain in left hip: Secondary | ICD-10-CM

## 2020-02-07 DIAGNOSIS — M25512 Pain in left shoulder: Secondary | ICD-10-CM

## 2020-02-07 NOTE — Telephone Encounter (Signed)
Per Dr. Margaretha Sheffield - we will refer her to Gayla Doss at [facility]. She will be in network with insurance. They will call her to schedule. If she has any issues she will contact us.

## 2020-02-09 ENCOUNTER — Encounter: Payer: Self-pay | Admitting: Family Medicine

## 2020-02-09 DIAGNOSIS — R21 Rash and other nonspecific skin eruption: Secondary | ICD-10-CM

## 2020-02-13 MED FILL — AMPHETAMINE SALTS 10 MG: 10 | 30 days supply | Qty: 60 | Fill #0

## 2020-03-12 ENCOUNTER — Other Ambulatory Visit: Payer: Self-pay | Admitting: Family Medicine

## 2020-03-12 ENCOUNTER — Other Ambulatory Visit (HOSPITAL_COMMUNITY): Payer: Self-pay | Admitting: Family Medicine

## 2020-03-12 DIAGNOSIS — F988 Other specified behavioral and emotional disorders with onset usually occurring in childhood and adolescence: Secondary | ICD-10-CM

## 2020-03-12 MED ORDER — AMPHETAMINE-DEXTROAMPHETAMINE 10 MG PO TABS: 10.0000 mg | ORAL_TABLET | Freq: Two times a day (BID) | ORAL | 0 refills | Status: DC

## 2020-03-12 MED FILL — AMPHETAMINE SALTS 10 MG: 10 | 30 days supply | Qty: 60 | Fill #0

## 2020-03-12 NOTE — Telephone Encounter (Signed)
[place] is requesting to fill pt adderall. Please advise [facility] 

## 2020-03-16 ENCOUNTER — Ambulatory Visit: Payer: No Typology Code available for payment source | Attending: Sports Medicine | Admitting: Physical Therapy

## 2020-03-16 ENCOUNTER — Other Ambulatory Visit: Payer: Self-pay

## 2020-03-16 ENCOUNTER — Encounter: Payer: Self-pay | Admitting: Physical Therapy

## 2020-03-16 DIAGNOSIS — G8929 Other chronic pain: Secondary | ICD-10-CM | POA: Diagnosis present

## 2020-03-16 DIAGNOSIS — R293 Abnormal posture: Secondary | ICD-10-CM | POA: Diagnosis present

## 2020-03-16 DIAGNOSIS — M25512 Pain in left shoulder: Secondary | ICD-10-CM | POA: Insufficient documentation

## 2020-03-16 DIAGNOSIS — R102 Pelvic and perineal pain: Secondary | ICD-10-CM | POA: Diagnosis present

## 2020-03-16 DIAGNOSIS — M533 Sacrococcygeal disorders, not elsewhere classified: Secondary | ICD-10-CM | POA: Insufficient documentation

## 2020-03-16 NOTE — Patient Instructions (Signed)
Access Code: [email or web address]   URL: [email or web address]: 03/11/2022Prepared by: Victorino Dike PaaExercises  Cat-Camel to Child's Pose - 1 x daily - 7 x weekly - 1 sets - 5 reps - 30 hold  Supine Hamstring Stretch with Strap - 1 x daily - 7 x weekly - 1 sets - 5 reps - 30 hold  Quadruped Serratus Strengthening - 2-3 x daily - 7 x weekly - 2 sets - 10 reps - 5 hold  90/90 SI Joint Self-Correction - 2-3 x daily - 7 x weekly - 1 sets - 10 reps - 5-8 hold And single arm serratus

## 2020-03-16 NOTE — Addendum Note (Signed)
Addended by: Karie Mainland L on: 03/16/2020 03:42 PM   Modules accepted: Orders

## 2020-03-16 NOTE — Therapy (Signed)
[place], [place], [address] Phone: [phone]   Fax:  [phone]  Physical Therapy Evaluation  Patient Details  Name: Holly Leblanc MRN: [id number] Date of Birth: 04/22/1985 Referring Provider (PT): Dr.Timothy Micheline Chapman   Encounter Date: 03/16/2020   PT End of Session - 03/16/20 1522    Visit Number 1    Number of Visits 8    Date for PT Re-Evaluation 05/11/20    PT Start Time 1220    PT Stop Time 1308    PT Time Calculation (min) 48 min           Past Medical History:  Diagnosis Date  . ADD (attention deficit disorder)   . Raynaud's disease     Past Surgical History:  Procedure Laterality Date  . TONSILLECTOMY      There were no vitals filed for this visit.    Subjective Assessment - 03/16/20 1224    Subjective Holly Leblanc presents with cc of L sided scapular, rib pain as well as now chronic L ischial tuberosity pain, developed about 2 yrs ago.  She was here in PT for 1 visit but was displeased with her evaluation. At that time she did not know what was going on with her shoulder.  She then saw Dr. Micheline Chapman in January.  reports pain as ongoing and bothersome especially as she exercises.  If she does a harder workout she has increased pain in shoulder girdle (bodyweight).  L pelvis/hip./leg hurts more when she sits for long periods.  She notices a weakness in her L UE with certain exercises (side plank) . L hand rarely numb.  She has soreness in L hamstring. She has tried stretching, massage. She would like to figure out how to fix her problems with corrective exercises.    Pertinent History cervical rib, Raynauds, anxiety, ADD    Limitations Sitting;Walking    How long can you sit comfortably? can be in the car for 3 hours but not comfortably, leans to L    Patient Stated Goals Find out what I can do to fix this    Currently in Pain? Yes    Pain Score 1     Pain Location Scapula    Pain  Orientation Left    Pain Descriptors / Indicators Discomfort    Pain Type Chronic pain    Pain Radiating Towards wraps to scapula , ribs, trunk on L side    Pain Onset More than a month ago    Pain Frequency Constant    Aggravating Factors  heavy workout,    Pain Relieving Factors rest    Effect of Pain on Daily Activities Hard to be comfortable, not symmetrical    Multiple Pain Sites Yes    Pain Score 3    Pain Location Leg   pelvis, ischial   Pain Orientation Left;Proximal    Pain Descriptors / Indicators Discomfort    Pain Type Chronic pain    Pain Radiating Towards hamstring    Pain Onset More than a month ago    Pain Frequency Constant    Aggravating Factors  sometimes massaginf it, tennis ball    Pain Relieving Factors standing, moving a bit    Effect of Pain on Daily Activities sitting impaired              [facility] PT Assessment - 03/16/20 0001      Assessment   Medical Diagnosis L scapular dyskinesis, L ischila tub bursisits  Referring Provider (PT) Dr.Timothy Draper    Onset Date/Surgical Date --   chronic   Hand Dominance Left    Next MD Visit post PT    Prior Therapy no      Precautions   Precautions None      Restrictions   Weight Bearing Restrictions No      Balance Screen   Has the patient fallen in the past 6 months No      [place] residence    Living Arrangements Alone    Type of Home Apartment      Prior Function   Level of Independence Independent    Vocation Full time employment    Vocation Requirements Internal med resident    Leisure travel, reading, exercises      Cognition   Overall Cognitive Status Within Functional Limits for tasks assessed      Observation/Other Assessments   Focus on Therapeutic Outcomes (FOTO)  email sent      Sensation   Light Touch Appears Intact      Coordination   Gross Motor Movements are Fluid and Coordinated Not tested      Posture/Postural Control    Posture/Postural Control Postural limitations    Posture Comments L high shoulder, L scapula tipped anteriorly, scap winging on wall as well as with functional reach to mid back (IR)   L ASIS higher , Lt post innominate     AROM   Overall AROM Comments limited in L UE flexion compared to Rt, elbow slightly bent and increased tension superiorly and into lat    Right Shoulder Internal Rotation --   mid back   Left Shoulder Internal Rotation --   FR to mid thoracic , greater than Rt UE, winging!   Left Shoulder External Rotation 90 Degrees      Strength   Overall Strength Comments WFL in bilateral UEs.  NT hip abd and hip ext (TBA)      Flexibility   Hamstrings L hamstring WNL and Rt WNL as well . Lt leg subjectively less tight than Rt    Piriformis tight      Palpation   SI assessment  post innominate L    Palpation comment tender L SIJ as well as medial border of scapula                      Objective measurements completed on examination: See above findings.       [place] Adult PT Treatment/Exercise - 03/16/20 0001      Shoulder Exercises: ROM/Strengthening   Prot/Ret//Elev/Dep q-ped: serratus lift x 10 each side palm up, then single arm seratus punch x 15      Manual Therapy   Muscle Energy Technique MET for L post innominate : L hip flex, Rt hip ext , follow with leg off table                  PT Education - 03/16/20 1521    Education Details PT/POC, posture, eval findings, HEP, DN    Person(s) Educated Patient    Methods Explanation    Comprehension Verbalized understanding;Returned demonstration            PT Short Term Goals - 03/16/20 1522      PT SHORT TERM GOAL #1   Title Patient will be I with initial HEP to progress with therapy    Time 4    Period Weeks  Status New    Target Date 04/13/20      PT SHORT TERM GOAL #2   Title Pt will be able to show (and report )improved ease/ symmetry of shoulder flexion, ER and IR    Time 4     Period Weeks    Status New    Target Date 04/13/20      PT SHORT TERM GOAL #3   Title Pt will complete FOTO and understand outcome potential.    Time 4    Period Weeks    Status New    Target Date 04/13/20             PT Long Term Goals - 03/16/20 1525      PT LONG TERM GOAL #1   Title Patient will be I with advanced HEP to maintain progress    Time 8    Period Weeks    Status New    Target Date 05/11/20      PT LONG TERM GOAL #2   Title Patient will report no difficulty performing vigorous exercise or recreational activities    Time 8    Period Weeks    Status New    Target Date 05/11/20      PT LONG TERM GOAL #3   Title Patient will report no difficulty sitting >2 hour to allow for improved working ability, travel    Time 8    Period Weeks    Status New    Target Date 05/11/20      PT LONG TERM GOAL #4   Title Sideplank strength will be equal and balanced, holding 30 sec with proper alignment and equal effort.    Time 8    Period Weeks    Status New    Target Date 05/11/20      PT LONG TERM GOAL #5   Title FOTO score TBD                  Plan - 03/16/20 1520    Clinical Impression Statement Patient presents to PT for postural alignment and control issues originating in the shoulder girdle and now affecting pelvis.  She showed normal strength other than scapular winging with closed chain UE work and functional reaching. She has a L posteriorly rotated innominate which we addressed with MET.  Educated on exercise form and stability following alignment exercises. Next visit will cont to address imbalances, look more closely at isolating hip weakness and begin trigger point dry needling.  She should expect some improvement but given the legnth of time she has been having these issues, she will likely need a long term plan for symptom mgmt and postural exercises.    Personal Factors and Comorbidities Time since onset of  injury/illness/exacerbation;Past/Current Experience    Examination-Activity Limitations Sit;Reach Overhead    Examination-Participation Restrictions Community Activity;Other;Occupation   recreation   Stability/Clinical Decision Making Evolving/Moderate complexity    Clinical Decision Making Moderate    Rehab Potential Excellent    PT Frequency 1x / week    PT Duration 8 weeks    PT Treatment/Interventions ADLs/Self Care Home Management;Cryotherapy;Therapeutic exercise;Taping;Manual techniques;Functional mobility training;Iontophoresis 25m/ml Dexamethasone;Therapeutic activities;Dry needling;Neuromuscular re-education;Moist Heat;Patient/family education;Joint Manipulations;Spinal Manipulations    PT Next Visit Plan DN to lats, subscap? Hamstring, piriformis? check HEP, balance posture and alignment    PT Home Exercise Plan [id number]   Consulted and Agree with Plan of Care Patient           Patient will  benefit from skilled therapeutic intervention in order to improve the following deficits and impairments:  Decreased mobility,Pain,Postural dysfunction,Impaired UE functional use,Impaired flexibility,Decreased strength,Decreased range of motion  Visit Diagnosis: Chronic left shoulder pain  Abnormal posture  Pain in pelvis  Sacroiliac dysfunction     Problem List Patient Active Problem List   Diagnosis Date Noted  . Encounter for counseling 01/20/2020  . Bacterial conjunctivitis 12/09/2019  . Raynaud's disease   . History of rosacea 07/14/2019  . Rosacea 05/27/2019  . Notalgia 08/11/2018  . Situational anxiety 10/01/2017  . ADD (attention deficit disorder)     PAA,JENNIFER 03/16/2020, 3:36 PM  [facility] [address] [place], [place], [address] Phone: [phone]   Fax:  [phone]  Name: Holly Leblanc MRN: [id number] Date of Birth: 03/05/1985   Raeford Razor, PT 03/16/20 3:36 PM Phone:  [phone] Fax: [phone]

## 2020-03-23 ENCOUNTER — Ambulatory Visit: Payer: No Typology Code available for payment source | Admitting: Plastic Surgery

## 2020-04-06 ENCOUNTER — Telehealth: Payer: Self-pay | Admitting: Family Medicine

## 2020-04-06 ENCOUNTER — Encounter: Payer: Self-pay | Admitting: Physical Therapy

## 2020-04-06 ENCOUNTER — Other Ambulatory Visit: Payer: Self-pay

## 2020-04-06 ENCOUNTER — Ambulatory Visit: Payer: No Typology Code available for payment source | Attending: Sports Medicine | Admitting: Physical Therapy

## 2020-04-06 DIAGNOSIS — R102 Pelvic and perineal pain: Secondary | ICD-10-CM | POA: Insufficient documentation

## 2020-04-06 DIAGNOSIS — G8929 Other chronic pain: Secondary | ICD-10-CM | POA: Diagnosis present

## 2020-04-06 DIAGNOSIS — R293 Abnormal posture: Secondary | ICD-10-CM | POA: Diagnosis present

## 2020-04-06 DIAGNOSIS — M25512 Pain in left shoulder: Secondary | ICD-10-CM | POA: Insufficient documentation

## 2020-04-06 DIAGNOSIS — M533 Sacrococcygeal disorders, not elsewhere classified: Secondary | ICD-10-CM | POA: Diagnosis present

## 2020-04-06 NOTE — Telephone Encounter (Signed)
Pt called and wants to schedule for a TDAP shot. She will check with her insurance first to see if they will pay. She was advised that I had to check with you first and that you were out of the office until next week.

## 2020-04-06 NOTE — Therapy (Signed)
[facility] [address] [place], [place], [address] Phone: [phone]   Fax:  [phone]  Physical Therapy Treatment  Patient Details  Name: Starlina Lapre MRN: [id number] Date of Birth: 06-06-1985 Referring Provider (PT): Dr.Timothy Margaretha Sheffield   Encounter Date: 04/06/2020   PT End of Session - 04/06/20 1358    Visit Number 2    Number of Visits 8    Date for PT Re-Evaluation 05/11/20    PT Start Time 1359    PT Stop Time 1445    PT Time Calculation (min) 46 min    Activity Tolerance Patient tolerated treatment well    Behavior During Therapy [facility] for tasks assessed/performed           Past Medical History:  Diagnosis Date  . ADD (attention deficit disorder)   . Raynaud's disease     Past Surgical History:  Procedure Laterality Date  . TONSILLECTOMY      There were no vitals filed for this visit.   Subjective Assessment - 04/06/20 1359    Subjective "I have noticed some differences since I was last in, still have some issues in my shoulder. I would like to focus more on the shoulder today instead of my hip.    Patient Stated Goals Find out what I can do to fix this    Currently in Pain? Yes    Pain Score 1     Pain Location Scapula    Pain Orientation Left    Pain Descriptors / Indicators Discomfort    Pain Onset More than a month ago    Pain Frequency Intermittent    Aggravating Factors  heavy workout              [facility] PT Assessment - 04/06/20 0001      Assessment   Medical Diagnosis L scapular dyskinesis, L ischila tub bursisits    Referring Provider (PT) Dr.Timothy Marlane [facility] Adult PT Treatment/Exercise - 04/06/20 0001      Shoulder Exercises: Standing   Protraction Strengthening;Left   3 x 30 sec rhythmic stabilization   External Rotation Strengthening;20 reps;Theraband    Theraband Level (Shoulder External Rotation) Level 3 (Green)    Internal  Rotation Strengthening;Left;20 reps;Theraband    Theraband Level (Shoulder Internal Rotation) Level 3 (Green)    Other Standing Exercises lower trap strengtheing 2 x 15 (1 x with red and green band) elbows propped on table, 1 x 20 wall y's    Other Standing Exercises lat strengtheing with band pulling down with bil UE in slight trunk flexion pos 1 x 5   noted difficulty with.     Shoulder Exercises: ROM/Strengthening   UBE (Upper Arm Bike) L4 x 4 min   fwd/bwd x 2 min ea.     Manual Therapy   Manual therapy comments skilled palpation and monitoring of pt throughout tPDN    Muscle Energy Technique IASTM along posterior shoulder musculature            Trigger Point Dry Needling - 04/06/20 0001    Consent Given? Yes    Education Handout Provided Yes    Muscles Treated Head and Neck Upper trapezius;Levator scapulae    Muscles Treated Upper Quadrant Latissimus dorsi;Rhomboids;Subscapularis;Triceps    Upper Trapezius Response Twitch reponse elicited;Palpable increased muscle length   L   Levator Scapulae  Response Twitch response elicited;Palpable increased muscle length   L   Rhomboids Response Palpable increased muscle length;Twitch response elicited   L only   Subscapularis Response Twitch response elicited;Palpable increased muscle length   L only   Latissimus dorsi Response Twitch response elicited;Palpable increased muscle length   L only   Triceps Response Twitch response elicited;Palpable increased muscle length   Proximal               PT Education - 04/06/20 1449    Education Details Reviewed and updated HEP for scapular stability. Reviewed TPDN and benefits of treatment / rationale.    Person(s) Educated Patient    Methods Explanation;Verbal cues;Handout    Comprehension Verbalized understanding;Verbal cues required            PT Short Term Goals - 03/16/20 1522      PT SHORT TERM GOAL #1   Title Patient will be I with initial HEP to progress with therapy    Time  4    Period Weeks    Status New    Target Date 04/13/20      PT SHORT TERM GOAL #2   Title Pt will be able to show (and report )improved ease/ symmetry of shoulder flexion, ER and IR    Time 4    Period Weeks    Status New    Target Date 04/13/20      PT SHORT TERM GOAL #3   Title Pt will complete FOTO and understand outcome potential.    Time 4    Period Weeks    Status New    Target Date 04/13/20             PT Long Term Goals - 03/16/20 1525      PT LONG TERM GOAL #1   Title Patient will be I with advanced HEP to maintain progress    Time 8    Period Weeks    Status New    Target Date 05/11/20      PT LONG TERM GOAL #2   Title Patient will report no difficulty performing vigorous exercise or recreational activities    Time 8    Period Weeks    Status New    Target Date 05/11/20      PT LONG TERM GOAL #3   Title Patient will report no difficulty sitting >2 hour to allow for improved working ability, travel    Time 8    Period Weeks    Status New    Target Date 05/11/20      PT LONG TERM GOAL #4   Title Sideplank strength will be equal and balanced, holding 30 sec with proper alignment and equal effort.    Time 8    Period Weeks    Status New    Target Date 05/11/20      PT LONG TERM GOAL #5   Title FOTO score TBD                 Plan - 04/06/20 1451    Clinical Impression Statement pt reports she is doing a little better since the last session. educated and consent was given for TPDN focusing on the sub-scapularis, latissimus dorsi, rhomboids, levator scapuale/ upper trap. visually she demonstrates reducted scapular control with descent on the L compared bil. per pt request opted to focus on shoulder strengthening which she did fatigue with shoulder IR/ER and lower trap activation, especially with lat / teres major  activation.    PT Treatment/Interventions ADLs/Self Care Home Management;Cryotherapy;Therapeutic exercise;Taping;Manual  techniques;Functional mobility training;Iontophoresis 4mg /ml Dexamethasone;Therapeutic activities;Dry needling;Neuromuscular re-education;Moist Heat;Patient/family education;Joint Manipulations;Spinal Manipulations    PT Next Visit Plan REsponse to DN. Hamstring, piriformis? check HEP, balance posture and alignment    Consulted and Agree with Plan of Care Patient           Patient will benefit from skilled therapeutic intervention in order to improve the following deficits and impairments:  Decreased mobility,Pain,Postural dysfunction,Impaired UE functional use,Impaired flexibility,Decreased strength,Decreased range of motion  Visit Diagnosis: Chronic left shoulder pain  Abnormal posture  Pain in pelvis  Sacroiliac dysfunction     Problem List Patient Active Problem List   Diagnosis Date Noted  . Encounter for counseling 01/20/2020  . Bacterial conjunctivitis 12/09/2019  . Raynaud's disease   . History of rosacea 07/14/2019  . Rosacea 05/27/2019  . Notalgia 08/11/2018  . Situational anxiety 10/01/2017  . ADD (attention deficit disorder)     10/03/2017 PT, DPT, LAT, ATC  04/06/20  3:02 PM      [facility] [address] [place], [place], [place] Phone: [phone]   Fax:  [phone]  Name: Kaylynne Andres MRN: Caffie Damme Date of Birth: October 05, 1985

## 2020-04-11 NOTE — Telephone Encounter (Signed)
That is fine.   Thank you

## 2020-04-12 ENCOUNTER — Other Ambulatory Visit: Payer: Self-pay

## 2020-04-12 ENCOUNTER — Other Ambulatory Visit (INDEPENDENT_AMBULATORY_CARE_PROVIDER_SITE_OTHER): Payer: No Typology Code available for payment source

## 2020-04-12 ENCOUNTER — Telehealth: Payer: Self-pay | Admitting: Internal Medicine

## 2020-04-12 ENCOUNTER — Other Ambulatory Visit (HOSPITAL_COMMUNITY): Payer: Self-pay

## 2020-04-12 ENCOUNTER — Other Ambulatory Visit: Payer: Self-pay | Admitting: Family Medicine

## 2020-04-12 DIAGNOSIS — F988 Other specified behavioral and emotional disorders with onset usually occurring in childhood and adolescence: Secondary | ICD-10-CM

## 2020-04-12 DIAGNOSIS — Z23 Encounter for immunization: Secondary | ICD-10-CM | POA: Diagnosis not present

## 2020-04-12 MED ORDER — AMPHETAMINE-DEXTROAMPHETAMINE 10 MG PO TABS
10.0000 mg | ORAL_TABLET | Freq: Two times a day (BID) | ORAL | 0 refills | Status: DC
  Filled 2020-04-12: qty 60, 30d supply, fill #0

## 2020-04-12 NOTE — Telephone Encounter (Signed)
Refill request for adderall 10mg  to [facility]

## 2020-04-12 NOTE — Telephone Encounter (Signed)
Please deny this as not the correct MRN

## 2020-04-12 NOTE — Telephone Encounter (Signed)
Not the correct MRN that we have on file for patient

## 2020-04-12 NOTE — Telephone Encounter (Signed)
done

## 2020-04-13 ENCOUNTER — Other Ambulatory Visit (HOSPITAL_COMMUNITY): Payer: Self-pay

## 2020-04-13 ENCOUNTER — Ambulatory Visit: Payer: No Typology Code available for payment source | Admitting: Physical Therapy

## 2020-04-19 ENCOUNTER — Ambulatory Visit: Payer: No Typology Code available for payment source | Admitting: Physical Therapy

## 2020-04-24 ENCOUNTER — Encounter: Payer: Self-pay | Admitting: Physical Therapy

## 2020-04-24 ENCOUNTER — Other Ambulatory Visit: Payer: Self-pay

## 2020-04-24 ENCOUNTER — Ambulatory Visit: Payer: No Typology Code available for payment source | Admitting: Physical Therapy

## 2020-04-24 DIAGNOSIS — M25512 Pain in left shoulder: Secondary | ICD-10-CM

## 2020-04-24 DIAGNOSIS — R102 Pelvic and perineal pain: Secondary | ICD-10-CM

## 2020-04-24 DIAGNOSIS — G8929 Other chronic pain: Secondary | ICD-10-CM

## 2020-04-24 DIAGNOSIS — R293 Abnormal posture: Secondary | ICD-10-CM

## 2020-04-24 NOTE — Patient Instructions (Signed)

## 2020-04-24 NOTE — Therapy (Addendum)
[place], [place], [address] Phone: [phone]   Fax:  [phone]  Physical Therapy Treatment/Discharge  Patient Details  Name: Jalecia Leon MRN: [id number] Date of Birth: 07-22-1985 Referring Provider (PT): Dr.Timothy Micheline Chapman   Encounter Date: 04/24/2020   PT End of Session - 04/24/20 0853     Visit Number 3    Number of Visits 8    Date for PT Re-Evaluation 05/11/20    PT Start Time 0852   pt arrived late   PT Stop Time 0925    PT Time Calculation (min) 33 min    Activity Tolerance Patient tolerated treatment well    Behavior During Therapy [facility] for tasks assessed/performed             Past Medical History:  Diagnosis Date   ADD (attention deficit disorder)    Raynaud's disease     Past Surgical History:  Procedure Laterality Date   TONSILLECTOMY      There were no vitals filed for this visit.   Subjective Assessment - 04/24/20 0853     Subjective "I think the DN helped. I think the therapy has helped as well. I am starting to localize the weakness."                [facility] PT Assessment - 04/24/20 0001       Assessment   Medical Diagnosis L scapular dyskinesis, L ischila tub bursisits    Referring Provider (PT) Dr.Timothy Audley [facility] Adult PT Treatment/Exercise - 04/24/20 0001       Knee/Hip Exercises: Seated   Hamstring Curl Strengthening;Right;1 set;10 reps   with green band with emphasis on eccentrics     Knee/Hip Exercises: Prone   Hamstring Curl 1 set;15 reps   focus on eccentric loading with manual resistance     Modalities   Modalities Iontophoresis      Iontophoresis   Type of Iontophoresis Dexamethasone    Location --   L proximal hamstring tendon   Dose 66m/ml    Time 6 hour patch      Manual Therapy   Manual therapy comments skilled palpation and monitoring of pt throughout tPDN    Muscle Energy  Technique IASTM along the hamstring              Trigger Point Dry Needling - 04/24/20 0001     Consent Given? Yes    Education Handout Provided Yes    Muscles Treated Lower Quadrant Hamstring    Hamstring Response Twitch response elicited;Palpable increased muscle length   bicep femoris                   PT Short Term Goals - 03/16/20 1522       PT SHORT TERM GOAL #1   Title Patient will be I with initial HEP to progress with therapy    Time 4    Period Weeks    Status New    Target Date 04/13/20      PT SHORT TERM GOAL #2   Title Pt will be able to show (and report )improved ease/ symmetry of shoulder flexion, ER and IR    Time 4    Period Weeks    Status New    Target Date 04/13/20      PT  SHORT TERM GOAL #3   Title Pt will complete FOTO and understand outcome potential.    Time 4    Period Weeks    Status New    Target Date 04/13/20               PT Long Term Goals - 03/16/20 1525       PT LONG TERM GOAL #1   Title Patient will be I with advanced HEP to maintain progress    Time 8    Period Weeks    Status New    Target Date 05/11/20      PT LONG TERM GOAL #2   Title Patient will report no difficulty performing vigorous exercise or recreational activities    Time 8    Period Weeks    Status New    Target Date 05/11/20      PT LONG TERM GOAL #3   Title Patient will report no difficulty sitting >2 hour to allow for improved working ability, travel    Time 8    Period Weeks    Status New    Target Date 05/11/20      PT LONG TERM GOAL #4   Title Sideplank strength will be equal and balanced, holding 30 sec with proper alignment and equal effort.    Time 8    Period Weeks    Status New    Target Date 05/11/20      PT LONG TERM GOAL #5   Title FOTO score TBD                   Plan - 04/24/20 0935     Clinical Impression Statement pt reports she is doing better in the shoulder and has been consistent with her HEP.  She opted to have her hip treated today, which further assessment was performed noting no specific innominate positioning, pt was more tender at he ischial tuberosity, and the hamstring tendon. TPDN was performed on the bicep femoris on the L followed with IASTM techniques and eccentric loading of the muscle. edcuated and applied iontophoresis for the proximal hamstring tendon.    PT Treatment/Interventions ADLs/Self Care Home Management;Cryotherapy;Therapeutic exercise;Taping;Manual techniques;Functional mobility training;Iontophoresis 58m/ml Dexamethasone;Therapeutic activities;Dry needling;Neuromuscular re-education;Moist Heat;Patient/family education;Joint Manipulations;Spinal Manipulations    PT Next Visit Plan REsponse to DN. Hamstring, hamstring eccentric loading,  check HEP, balance posture and alignment    PT Home Exercise Plan [id number]   Consulted and Agree with Plan of Care Patient             Patient will benefit from skilled therapeutic intervention in order to improve the following deficits and impairments:  Decreased mobility,Pain,Postural dysfunction,Impaired UE functional use,Impaired flexibility,Decreased strength,Decreased range of motion  Visit Diagnosis: Chronic left shoulder pain  Abnormal posture  Pain in pelvis     Problem List Patient Active Problem List   Diagnosis Date Noted   Encounter for counseling 01/20/2020   Bacterial conjunctivitis 12/09/2019   Raynaud's disease    History of rosacea 07/14/2019   Rosacea 05/27/2019   Notalgia 08/11/2018   Situational anxiety 10/01/2017   ADD (attention deficit disorder)     KStarr LakePT, DPT, LAT, ATC  04/24/20  9:42 AM      [facility] [place] [address]Phone: [phone]  Fax:  [phone] Name: JMelinna LinarezMRN: [id number] of Birth: [birth year] PHYSICAL THERAPY DISCHARGE SUMMARY  [facility]  Current functional level related to goals / functional outcomes: See above    Remaining deficits: Unknown    Education / Equipment: HEP, posture   Patient agrees to discharge. Patient goals were partially met. Patient is being discharged due to not returning since the last visit.  Pt was Covid Pos and did not return.  Raeford Razor, PT 06/28/20 9:56 AM Phone: [phone] Fax: [phone]

## 2020-05-04 ENCOUNTER — Encounter: Payer: No Typology Code available for payment source | Admitting: Physical Therapy

## 2020-05-09 ENCOUNTER — Telehealth: Payer: Self-pay | Admitting: Physical Therapy

## 2020-05-09 ENCOUNTER — Ambulatory Visit: Payer: No Typology Code available for payment source | Attending: Sports Medicine | Admitting: Physical Therapy

## 2020-05-09 NOTE — Telephone Encounter (Signed)
Called patient regarding her appt today at 4:15, she was a no show.  Left voicemail and number to call to reschedule.  .Reminded of next appt 05/23/20   Karie Mainland, PT 05/09/20 4:38 PM Phone: [phone] Fax: [phone]

## 2020-05-15 ENCOUNTER — Other Ambulatory Visit: Payer: Self-pay | Admitting: Family Medicine

## 2020-05-15 ENCOUNTER — Other Ambulatory Visit (HOSPITAL_COMMUNITY): Payer: Self-pay

## 2020-05-15 DIAGNOSIS — F988 Other specified behavioral and emotional disorders with onset usually occurring in childhood and adolescence: Secondary | ICD-10-CM

## 2020-05-15 MED ORDER — AMPHETAMINE-DEXTROAMPHETAMINE 10 MG PO TABS
10.0000 mg | ORAL_TABLET | Freq: Every day | ORAL | 0 refills | Status: DC | PRN
  Filled 2020-05-15: qty 30, 30d supply, fill #0

## 2020-05-15 MED ORDER — AMPHETAMINE-DEXTROAMPHET ER 10 MG PO CP24
10.0000 mg | ORAL_CAPSULE | Freq: Every day | ORAL | 0 refills | Status: DC
  Filled 2020-05-15: qty 30, 30d supply, fill #0

## 2020-05-23 ENCOUNTER — Ambulatory Visit: Payer: No Typology Code available for payment source | Admitting: Physical Therapy

## 2020-05-30 ENCOUNTER — Ambulatory Visit: Payer: No Typology Code available for payment source | Admitting: Physical Therapy

## 2020-06-07 ENCOUNTER — Ambulatory Visit: Payer: No Typology Code available for payment source | Admitting: Physical Therapy

## 2020-06-12 ENCOUNTER — Other Ambulatory Visit (HOSPITAL_COMMUNITY): Payer: Self-pay

## 2020-06-12 ENCOUNTER — Other Ambulatory Visit: Payer: Self-pay | Admitting: Family Medicine

## 2020-06-12 DIAGNOSIS — F988 Other specified behavioral and emotional disorders with onset usually occurring in childhood and adolescence: Secondary | ICD-10-CM

## 2020-06-12 NOTE — Telephone Encounter (Signed)
If it has been 6 months since our last visit regarding her ADD then we need to do a virtual or in office visit (her choice). I can go ahead and refill for 30 days if we get her scheduled.

## 2020-06-12 NOTE — Telephone Encounter (Signed)
Brule out pt is requesting refill on adderall. Please advise Kh

## 2020-06-13 ENCOUNTER — Other Ambulatory Visit (HOSPITAL_COMMUNITY): Payer: Self-pay

## 2020-06-13 MED ORDER — AMPHETAMINE-DEXTROAMPHETAMINE 10 MG PO TABS
10.0000 mg | ORAL_TABLET | Freq: Every day | ORAL | 0 refills | Status: DC | PRN
  Filled 2020-06-13 (×2): qty 30, 30d supply, fill #0

## 2020-06-13 NOTE — Telephone Encounter (Signed)
Please refill. I have sent mychart message for her to call and schedule

## 2020-06-20 ENCOUNTER — Ambulatory Visit (INDEPENDENT_AMBULATORY_CARE_PROVIDER_SITE_OTHER): Payer: No Typology Code available for payment source | Admitting: Family Medicine

## 2020-06-20 VITALS — BP 110/70 | HR 86 | Wt 148.8 lb

## 2020-06-20 DIAGNOSIS — F418 Other specified anxiety disorders: Secondary | ICD-10-CM | POA: Diagnosis not present

## 2020-06-20 DIAGNOSIS — F988 Other specified behavioral and emotional disorders with onset usually occurring in childhood and adolescence: Secondary | ICD-10-CM | POA: Diagnosis not present

## 2020-06-20 MED ORDER — AMPHETAMINE-DEXTROAMPHETAMINE 10 MG PO TABS: 10.0000 mg | ORAL_TABLET | Freq: Every day | ORAL | 0 refills | Status: DC | PRN

## 2020-06-20 MED ORDER — ALPRAZOLAM 0.5 MG PO TABS: 0.5000 mg | ORAL_TABLET | Freq: Every day | ORAL | 0 refills | Status: AC | PRN

## 2020-06-20 NOTE — Progress Notes (Signed)
   Subjective:    Patient ID: Holly Leblanc, female    DOB: 1985/04/30, 35 y.o.   MRN: [id number]  HPI Chief Complaint  Patient presents with   med check    Med check, needs refills. Is moving to [facility] June 25th. Needs written rx    She is here to follow up on ADD, anxiety and medication follow-up. States she is not needing the Adderall XR and is doing well with only the immediate release. Takes Adderall 5 to 10 mg up to 20 mg per day.  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  States she mainly needs the medication to help her focus when listening to other people.  Last refilled on 06/13/2020 XR on 05/15/2020 and I will discontinue this  Requesting that I refill her next prescription for 06/30/2020 because she is moving and will want to pick it up before moving out of the state.  She is also requesting a refill of her alprazolam and she uses it sparingly but will need time to find a new PCP in [place].  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   States she did start seeing a therapist here in town but will find a new one when she is does have [place].  States she had Covid 3 weeks ago and symptoms have resolved completely.   Denies fever, chills, dizziness, chest pain, palpitations, shortness of breath, abdominal pain, N/V/D, urinary symptoms, LE edema.    Review of Systems Pertinent positives and negatives in the history of present illness.     Objective:   Physical Exam BP 110/70   Pulse 86   Wt 148 lb 12.8 oz (67.5 kg)   BMI 21.97 kg/m   Alert and oriented and in no acute distress.  Respirations unlabored.  Normal speech, mood and memory.      Assessment & Plan:   Attention deficit disorder, unspecified hyperactivity presence - Plan: amphetamine-dextroamphetamine (ADDERALL) 10 MG tablet  Situational anxiety - Plan: ALPRAZolam (XANAX) 0.5 MG tablet  Discussed that we have never done blood work here.   She does have labs that were done elsewhere and will upload them to her MyChart portal.  Declines labs today. Reviewed PDMP.  Discussed that I am okay refilling her Adderall early since she is moving out of state.  I printed this prescription for her.  She will find a new PCP when she gets to [place] at the end of the month.  I will also refill alprazolam and she will continue using this as needed.  She plans to find a new therapist when she moves as well.  I wish her the best in her new endeavors.

## 2020-06-27 ENCOUNTER — Ambulatory Visit (INDEPENDENT_AMBULATORY_CARE_PROVIDER_SITE_OTHER): Payer: Self-pay

## 2020-06-27 ENCOUNTER — Other Ambulatory Visit: Payer: Self-pay

## 2020-06-27 VITALS — BP 122/70 | HR 70 | Temp 96.8°F | Ht 69.0 in | Wt 145.0 lb

## 2020-06-27 DIAGNOSIS — Z719 Counseling, unspecified: Secondary | ICD-10-CM

## 2020-06-30 ENCOUNTER — Other Ambulatory Visit (HOSPITAL_COMMUNITY): Payer: Self-pay

## 2020-06-30 MED ORDER — ALPRAZOLAM 0.5 MG PO TABS
ORAL_TABLET | ORAL | 0 refills | Status: AC
  Filled 2020-06-30: qty 20, 20d supply, fill #0

## 2020-07-22 ENCOUNTER — Encounter: Payer: Self-pay | Admitting: Family Medicine

## 2020-07-22 DIAGNOSIS — F988 Other specified behavioral and emotional disorders with onset usually occurring in childhood and adolescence: Secondary | ICD-10-CM

## 2020-07-23 NOTE — Patient Instructions (Signed)
Patient will use sunscreen & moisturizer She will call for any concerns

## 2020-07-23 NOTE — Progress Notes (Signed)
Preoperative Dx: facial hyperpigmentation  Postoperative Dx:  same  Procedure: laser to face  Anesthesia: none  Description of Procedure:  Risks and complications were explained to the patient. Consent was confirmed and signed. Time out was called and all information was confirmed to be correct. The area  was prepped with alcohol and wiped dry.  The IPL laser was set at the following:  skin -1/ suntan none  = 6.8J The entire face was lasered The patient tolerated the procedure well and there were no complications.  Aloe applied  She is reminded to protect skin with sunscreen & moisturizer & avoid retina products & no abrasive scrubs for approx. 7 to 10 days  She is reminded that she may experience redness/peeling & irritation   She will call for any concerns [facility]

## 2020-07-24 MED ORDER — AMPHETAMINE-DEXTROAMPHETAMINE 10 MG PO TABS: 10.0000 mg | ORAL_TABLET | Freq: Two times a day (BID) | ORAL | 0 refills | Status: AC

## 2020-10-23 IMAGING — CR DG TOE 4TH 2+V*L*
3 series · 3 of 3 positions shown · non-contrast
Comparison: None.

CLINICAL DATA: Left fourth toe pain since the patient dropped a
weight on her toe in early June 2019. Initial encounter.

EXAM:
LEFT FOURTH TOE

[toe ap]
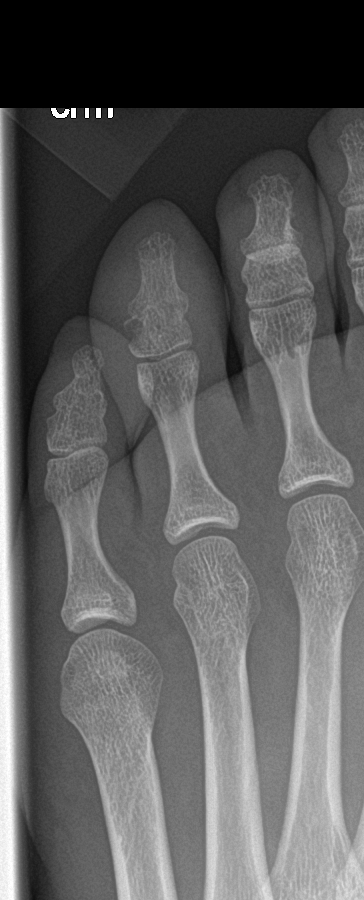

[toe obl]
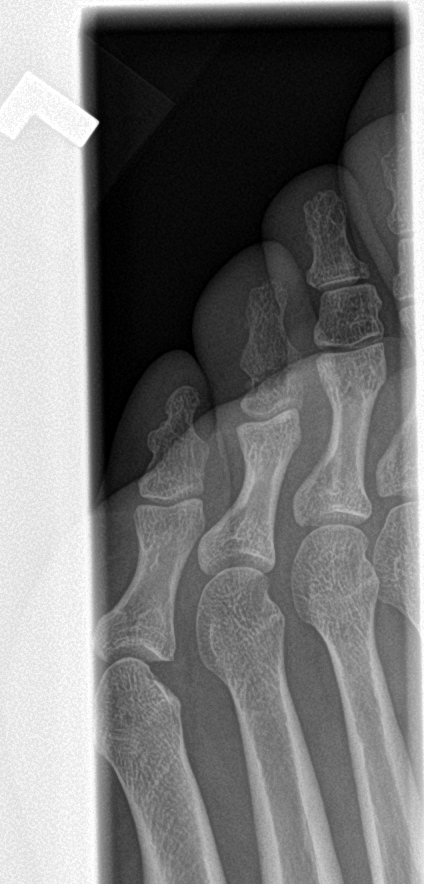

[toe lat]
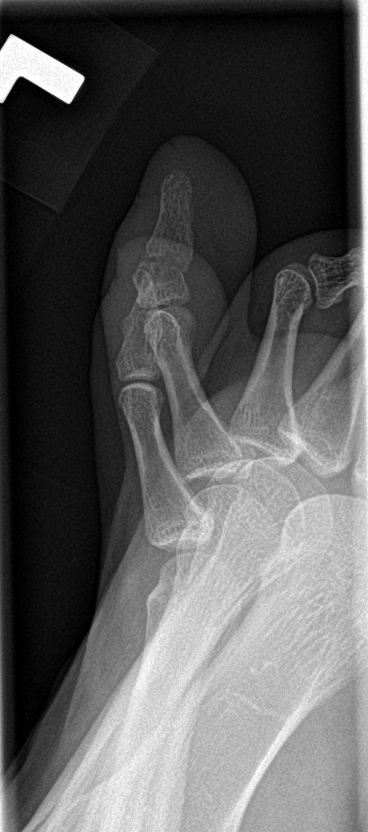

[3 of 3 positions shown; findings below may reference images not displayed]

FINDINGS: The middle and distal phalanges of the little toe are fused and
appear to be fused at the fourth toe. Lucency at the expected
location of the distal interphalangeal joint is worrisome for a
nondisplaced fracture. Soft tissues of the fourth toe appear swollen
distally.
IMPRESSION: Findings worrisome for a nondisplaced fracture through congenitally
fused middle and distal phalanges of the fourth toe with associated
soft tissue swelling.
